# Patient Record
Sex: Female | Born: 1965 | Race: White | Hispanic: No | Smoking: Current every day smoker
Health system: Southern US, Community
[De-identification: ages and names within clinical notes are randomized; demographics above are authoritative.]

## PROBLEM LIST (undated history)

## (undated) DIAGNOSIS — D241 Benign neoplasm of right breast: Secondary | ICD-10-CM

## (undated) DIAGNOSIS — F17200 Nicotine dependence, unspecified, uncomplicated: Secondary | ICD-10-CM

## (undated) HISTORY — PX: TUBAL LIGATION: SHX77

## (undated) HISTORY — PX: BREAST EXCISIONAL BIOPSY: SUR124

## (undated) HISTORY — PX: APPENDECTOMY: SHX54

---

## 2000-06-27 ENCOUNTER — Other Ambulatory Visit: Admission: RE | Admit: 2000-06-27 | Discharge: 2000-06-27 | Payer: Self-pay | Admitting: *Deleted

## 2002-05-28 ENCOUNTER — Other Ambulatory Visit: Admission: RE | Admit: 2002-05-28 | Discharge: 2002-05-28 | Payer: Self-pay | Admitting: *Deleted

## 2010-06-12 ENCOUNTER — Encounter (HOSPITAL_COMMUNITY): Payer: Self-pay | Admitting: Obstetrics and Gynecology

## 2011-01-12 ENCOUNTER — Other Ambulatory Visit: Payer: Self-pay | Admitting: Obstetrics and Gynecology

## 2011-01-12 DIAGNOSIS — N644 Mastodynia: Secondary | ICD-10-CM

## 2011-01-12 DIAGNOSIS — N63 Unspecified lump in unspecified breast: Secondary | ICD-10-CM

## 2011-01-21 ENCOUNTER — Ambulatory Visit
Admission: RE | Admit: 2011-01-21 | Discharge: 2011-01-21 | Disposition: A | Discharge status: Home / Self Care | Payer: 59 | Source: Ambulatory Visit | Attending: Obstetrics and Gynecology | Admitting: Obstetrics and Gynecology

## 2011-01-21 DIAGNOSIS — N63 Unspecified lump in unspecified breast: Secondary | ICD-10-CM

## 2011-01-21 DIAGNOSIS — N644 Mastodynia: Secondary | ICD-10-CM

## 2011-09-20 ENCOUNTER — Telehealth: Payer: Self-pay | Admitting: Obstetrics and Gynecology

## 2011-09-21 NOTE — Telephone Encounter (Signed)
Left msg on pt's voice mail to call back rgd rx refill request. bt cma

## 2011-09-23 NOTE — Telephone Encounter (Signed)
Spoke with pt on 09/22/11 rgd rx refill request from [facility] . Pt stated that she is request rx. Told pt would consult with EP and get approved and once approved fax it back to the pharmacy . Per ep approved sertraline hcl 25 mg sig 1 po qd with 5 refills.bt cma

## 2012-02-02 ENCOUNTER — Ambulatory Visit: Payer: Self-pay | Admitting: Obstetrics and Gynecology

## 2012-02-20 ENCOUNTER — Other Ambulatory Visit: Payer: Self-pay | Admitting: Obstetrics and Gynecology

## 2012-02-23 ENCOUNTER — Ambulatory Visit (INDEPENDENT_AMBULATORY_CARE_PROVIDER_SITE_OTHER): Payer: BC Managed Care – PPO | Admitting: Obstetrics and Gynecology

## 2012-02-23 ENCOUNTER — Encounter: Payer: Self-pay | Admitting: Obstetrics and Gynecology

## 2012-02-23 VITALS — BP 110/72 | Wt 146.6 lb

## 2012-02-23 DIAGNOSIS — N6459 Other signs and symptoms in breast: Secondary | ICD-10-CM

## 2012-02-23 DIAGNOSIS — N6452 Nipple discharge: Secondary | ICD-10-CM

## 2012-02-23 NOTE — Progress Notes (Signed)
Subjective:    Hannah Glover is a 46 y.o. female, G2P2002, who presents for liquid leaking from right nipple for about 2 weeks . Last mammo 01/21/2011 wnl   The following portions of the patient's history were reviewed and updated as appropriate: allergies, current medications, past family history.  Review of Systems Pertinent items are noted in HPI.  Objective:    BP 110/72  Wt 146 lb 9.6 oz (66.497 kg)  LMP 02/17/2012    Weight:  Wt Readings from Last 1 Encounters:  02/23/12 146 lb 9.6 oz (66.497 kg)          BMI: There is no height on file to calculate BMI.  General Appearance: Alert, appropriate appearance for age. No acute distress Breast: no masses, + clear discharge at 2 o"clock of right breast. No leaking left breast   Assessment:    Right breast discharge    Plan:    Diagnostic Mammogram Prolactin and TSH Breast Pap Follow-up 1 month AEX and review results  Silverio Lay MD

## 2012-02-24 LAB — THYROID PANEL WITH TSH
Free Thyroxine Index: 2.7 (ref 1.0–3.9)
T3 Uptake: 26.4 % (ref 22.5–37.0)
T4, Total: 10.2 ug/dL (ref 5.0–12.5)
TSH: 0.944 u[IU]/mL (ref 0.350–4.500)

## 2012-02-24 LAB — PROLACTIN: Prolactin: 11.4 ng/mL

## 2012-02-28 ENCOUNTER — Telehealth: Payer: Self-pay | Admitting: Obstetrics and Gynecology

## 2012-02-28 NOTE — Telephone Encounter (Signed)
Spoke with pt requesting breast discharge test results. Informed pt results aren't back yet and we will inform her of results after the provider signs off on it. Pt agrees and voices understanding.

## 2012-02-29 ENCOUNTER — Telehealth: Payer: Self-pay | Admitting: Obstetrics and Gynecology

## 2012-02-29 NOTE — Telephone Encounter (Signed)
sr to review results

## 2012-02-29 NOTE — Telephone Encounter (Signed)
Pt notified that labs are in but not reviewed and breast pap results are not in yet.  Will call as soon as resulted and reviewed.  Pt agreeable.  ld

## 2012-03-06 ENCOUNTER — Telehealth: Payer: Self-pay | Admitting: Obstetrics and Gynecology

## 2012-03-06 ENCOUNTER — Other Ambulatory Visit: Payer: Self-pay | Admitting: Obstetrics and Gynecology

## 2012-03-06 DIAGNOSIS — N6452 Nipple discharge: Secondary | ICD-10-CM

## 2012-03-06 NOTE — Telephone Encounter (Signed)
TC to pt at  11:30. Lab results reviewed.  [facility] order to schedule diagnostic mammogram. Consult with LD who states unilateral diagnostic mammo to be ordered.  Pt to call to schedule appt.

## 2012-03-09 ENCOUNTER — Ambulatory Visit
Admission: RE | Admit: 2012-03-09 | Discharge: 2012-03-09 | Disposition: A | Discharge status: Home / Self Care | Payer: BC Managed Care – PPO | Source: Ambulatory Visit | Attending: Obstetrics and Gynecology | Admitting: Obstetrics and Gynecology

## 2012-03-09 ENCOUNTER — Other Ambulatory Visit: Payer: Self-pay | Admitting: Obstetrics and Gynecology

## 2012-03-09 DIAGNOSIS — N6452 Nipple discharge: Secondary | ICD-10-CM

## 2012-03-12 NOTE — Progress Notes (Signed)
Quick Note:  The recent mammogram is incomplete / abnormal suggesting a close follow-up / additional images. When is the next appointment to follow-up on this mammogram?  Please document in chart. ______ 

## 2012-03-13 ENCOUNTER — Telehealth: Payer: Self-pay

## 2012-03-13 NOTE — Telephone Encounter (Signed)
ductogram scheduled for Fri 03-16-12.  ld

## 2012-03-13 NOTE — Telephone Encounter (Signed)
Message copied by Larwance Rote on Tue Mar 13, 2012 12:06 PM ------      Message from: Silverio Lay      Created: Mon Mar 12, 2012  1:49 PM       The recent mammogram is incomplete / abnormal suggesting a close follow-up / additional images.      When is the next appointment to follow-up on this mammogram?       Please document in chart.

## 2012-03-13 NOTE — Telephone Encounter (Signed)
Message copied by Larwance Rote on Tue Mar 13, 2012 12:34 PM ------      Message from: Silverio Lay      Created: Mon Mar 12, 2012  1:49 PM       The recent mammogram is incomplete / abnormal suggesting a close follow-up / additional images.      When is the next appointment to follow-up on this mammogram?       Please document in chart.

## 2012-03-13 NOTE — Telephone Encounter (Signed)
Na  ld

## 2012-03-16 ENCOUNTER — Ambulatory Visit
Admission: RE | Admit: 2012-03-16 | Discharge: 2012-03-16 | Disposition: A | Discharge status: Home / Self Care | Payer: BC Managed Care – PPO | Source: Ambulatory Visit | Attending: Obstetrics and Gynecology | Admitting: Obstetrics and Gynecology

## 2012-03-16 DIAGNOSIS — N6452 Nipple discharge: Secondary | ICD-10-CM

## 2012-03-20 ENCOUNTER — Encounter: Payer: Self-pay | Admitting: Obstetrics and Gynecology

## 2012-03-20 ENCOUNTER — Telehealth: Payer: Self-pay

## 2012-03-20 NOTE — Telephone Encounter (Signed)
Pt calling about ductogram results.  States she has an appt with Dr Carolynne Edouard on 03-29-12 to discuss.  Pt is very worried and wants to know what she can expect.  Will send to SR for instruction.  ld

## 2012-03-23 ENCOUNTER — Ambulatory Visit (INDEPENDENT_AMBULATORY_CARE_PROVIDER_SITE_OTHER): Payer: BC Managed Care – PPO | Admitting: Surgery

## 2012-03-23 NOTE — Telephone Encounter (Signed)
Dr Carolynne Edouard will probably recommend a biopsy

## 2012-03-26 ENCOUNTER — Encounter: Payer: Self-pay | Admitting: Obstetrics and Gynecology

## 2012-03-27 ENCOUNTER — Telehealth: Payer: Self-pay

## 2012-03-28 ENCOUNTER — Telehealth: Payer: Self-pay | Admitting: Obstetrics and Gynecology

## 2012-03-28 NOTE — Telephone Encounter (Signed)
Returned pt's call. States is seeing Dr Carolynne Edouard 03/29/12. Questioning who will F/U any surgery she has done.  Informed will need to F/U with surgeon unless has other concerns and then can see Dr SR. Pt verbalizes comprehension.

## 2012-03-28 NOTE — Telephone Encounter (Signed)
Returned pt's call. LM to return call.   

## 2012-03-29 ENCOUNTER — Encounter (INDEPENDENT_AMBULATORY_CARE_PROVIDER_SITE_OTHER): Payer: Self-pay | Admitting: General Surgery

## 2012-03-29 ENCOUNTER — Ambulatory Visit (INDEPENDENT_AMBULATORY_CARE_PROVIDER_SITE_OTHER): Payer: BC Managed Care – PPO | Admitting: General Surgery

## 2012-03-29 VITALS — BP 112/68 | HR 76 | Temp 97.8°F | Resp 16 | Ht 67.0 in | Wt 152.2 lb

## 2012-03-29 DIAGNOSIS — D249 Benign neoplasm of unspecified breast: Secondary | ICD-10-CM | POA: Insufficient documentation

## 2012-03-29 NOTE — Progress Notes (Signed)
Subjective:     Patient ID: Hannah Glover, female   DOB: 05/05/1966, 46 y.o.   MRN: [id number]  HPI We are asked to see the patient in consultation by Dr. Juel Burrow to evaluate her for a right breast intraductal papilloma. The patient is a 46 her white female who is been experiencing spontaneous nipple discharge from the right breast for the last 6 months or so. She states that this occurs on a daily basis. The fluid is mostly been clear yellow fluid but occasionally she'll see some blood mixed in. She denies any breast pain. She does have a family history of breast cancer in her mother diagnosed at the age of 46.  Review of Systems  Constitutional: Negative.   HENT: Negative.   Eyes: Negative.   Respiratory: Negative.   Cardiovascular: Negative.   Gastrointestinal: Negative.   Genitourinary: Negative.   Musculoskeletal: Negative.   Skin: Negative.   Neurological: Negative.   Hematological: Negative.   Psychiatric/Behavioral: Negative.        Objective:   Physical Exam  Constitutional: She is oriented to person, place, and time. She appears well-developed and well-nourished.  HENT:  Head: Normocephalic and atraumatic.  Eyes: Conjunctivae normal and EOM are normal. Pupils are equal, round, and reactive to light.  Neck: Normal range of motion. Neck supple.  Cardiovascular: Normal rate, regular rhythm and normal heart sounds.   Pulmonary/Chest: Effort normal and breath sounds normal.       There is no palpable mass in either breast. There is no palpable axillary or supraclavicular cervical lymphadenopathy. There is discharge from the upper inner quadrant of the right nipple with palpation. The discharge is clear yellow and not bloody.  Abdominal: Soft. Bowel sounds are normal.  Musculoskeletal: Normal range of motion.  Neurological: She is alert and oriented to person, place, and time.  Skin: Skin is warm and dry.  Psychiatric: She has a normal mood and affect. Her behavior is normal.         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 Because this lesion is considered a high risk lesion I think it would be reasonable to remove it especially given the nature of the discharge. I've discussed with her in detail the risks and benefits of the operation to remove this area of the subareolar duct system and she understands and wishes to proceed.    Plan:     Plan for right breast lumpectomy of the subareolar ductal system

## 2012-03-29 NOTE — Telephone Encounter (Signed)
NS spoke with pt

## 2012-03-29 NOTE — Patient Instructions (Signed)
Plan for right breast lumpectomy 

## 2012-05-03 DIAGNOSIS — D249 Benign neoplasm of unspecified breast: Secondary | ICD-10-CM

## 2012-05-03 HISTORY — PX: BREAST SURGERY: SHX581

## 2012-05-08 ENCOUNTER — Encounter (INDEPENDENT_AMBULATORY_CARE_PROVIDER_SITE_OTHER): Payer: Self-pay | Admitting: General Surgery

## 2012-05-09 ENCOUNTER — Telehealth (INDEPENDENT_AMBULATORY_CARE_PROVIDER_SITE_OTHER): Payer: Self-pay | Admitting: General Surgery

## 2012-05-09 NOTE — Telephone Encounter (Signed)
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

## 2012-05-09 NOTE — Telephone Encounter (Signed)
Message copied by Littie Deeds on Wed May 09, 2012  5:04 PM ------      Message from: Littie Deeds      Created: Tue May 08, 2012  4:59 PM       Request Pathology 12/18 if not received yet.

## 2012-05-11 ENCOUNTER — Encounter (INDEPENDENT_AMBULATORY_CARE_PROVIDER_SITE_OTHER): Payer: Self-pay | Admitting: General Surgery

## 2012-05-18 ENCOUNTER — Encounter (INDEPENDENT_AMBULATORY_CARE_PROVIDER_SITE_OTHER): Payer: Self-pay

## 2012-05-18 ENCOUNTER — Telehealth (INDEPENDENT_AMBULATORY_CARE_PROVIDER_SITE_OTHER): Payer: Self-pay | Admitting: General Surgery

## 2012-05-18 NOTE — Telephone Encounter (Signed)
Patient called to get a copy of path report. Copy sent from pathology. Patient aware she will get a copy at her follow up appt with Dr Carolynne Edouard on Tuesday.

## 2012-05-22 ENCOUNTER — Ambulatory Visit (INDEPENDENT_AMBULATORY_CARE_PROVIDER_SITE_OTHER): Payer: BC Managed Care – PPO | Admitting: General Surgery

## 2012-05-22 ENCOUNTER — Encounter (INDEPENDENT_AMBULATORY_CARE_PROVIDER_SITE_OTHER): Payer: Self-pay | Admitting: General Surgery

## 2012-05-22 VITALS — BP 114/70 | HR 72 | Temp 97.3°F | Resp 16 | Ht 67.0 in | Wt 155.4 lb

## 2012-05-22 DIAGNOSIS — D249 Benign neoplasm of unspecified breast: Secondary | ICD-10-CM

## 2012-05-22 NOTE — Progress Notes (Signed)
Subjective:     Patient ID: Hannah Glover, female   DOB: August 06, 1965, 46 y.o.   MRN: [id number]  HPI The patient is a 74 her white female who is about 3 weeks status post right breast lumpectomy for an intraductal papilloma. She continues to have some intermittent sharp shooting pains in the right breast. She denies any fevers or chills.  Review of Systems     Objective:   Physical Exam On exam her right breast incision is healing nicely with no sign of infection or significant seroma.    Assessment:     3 weeks status post right lumpectomy for a papilloma    Plan:     At this point I think she is doing well. We will plan to see her back in one month to check her progress and she is still having some pain.

## 2012-06-21 ENCOUNTER — Encounter (INDEPENDENT_AMBULATORY_CARE_PROVIDER_SITE_OTHER): Payer: BC Managed Care – PPO | Admitting: General Surgery

## 2012-06-25 ENCOUNTER — Encounter (INDEPENDENT_AMBULATORY_CARE_PROVIDER_SITE_OTHER): Payer: BC Managed Care – PPO | Admitting: General Surgery

## 2012-07-07 ENCOUNTER — Other Ambulatory Visit: Payer: Self-pay

## 2012-07-11 ENCOUNTER — Ambulatory Visit (INDEPENDENT_AMBULATORY_CARE_PROVIDER_SITE_OTHER): Payer: BC Managed Care – PPO | Admitting: General Surgery

## 2012-07-11 ENCOUNTER — Encounter (INDEPENDENT_AMBULATORY_CARE_PROVIDER_SITE_OTHER): Payer: Self-pay | Admitting: General Surgery

## 2012-07-11 VITALS — BP 112/82 | HR 94 | Temp 98.5°F | Resp 18 | Ht 67.0 in | Wt 154.0 lb

## 2012-07-11 DIAGNOSIS — D249 Benign neoplasm of unspecified breast: Secondary | ICD-10-CM

## 2012-07-11 NOTE — Progress Notes (Signed)
Subjective:     Patient ID: Hannah Glover, female   DOB: 12-09-1965, 47 y.o.   MRN: [id number]  HPI The patient is a 47 year old white female who is about 2 months status post right breast lumpectomy for a papilloma. She has done well and has no complaints today. The soreness of the breast has resolved.  Review of Systems     Objective:   Physical Exam On exam her right breast incision is healing nicely with no sign of infection or significant seroma.    Assessment:     The patient is 2 months status post lumpectomy for benign disease     Plan:     At this point I would encourage her to continue to do regular self exams. She will need followup annually with her regular medical doctor. We will plan to see her back on a when necessary basis

## 2012-07-11 NOTE — Patient Instructions (Signed)
Continue regular self exams  

## 2012-08-01 ENCOUNTER — Encounter (INDEPENDENT_AMBULATORY_CARE_PROVIDER_SITE_OTHER): Payer: Self-pay | Admitting: General Surgery

## 2012-08-17 ENCOUNTER — Other Ambulatory Visit: Payer: Self-pay | Admitting: Obstetrics and Gynecology

## 2012-08-20 LAB — PAP IG W/ RFLX HPV ASCU

## 2013-01-15 ENCOUNTER — Other Ambulatory Visit: Payer: Self-pay | Admitting: Obstetrics and Gynecology

## 2013-01-15 DIAGNOSIS — N63 Unspecified lump in unspecified breast: Secondary | ICD-10-CM

## 2013-01-15 DIAGNOSIS — N644 Mastodynia: Secondary | ICD-10-CM

## 2013-03-28 ENCOUNTER — Other Ambulatory Visit: Payer: Self-pay

## 2013-04-11 IMAGING — MG MM DUCTOGRAM UNILATERAL *R*
4 series · 4 of 4 positions shown · non-contrast
Comparison: none

CLINICAL DATA: Single duct right nipple discharge

[R CC (1 of 2)]
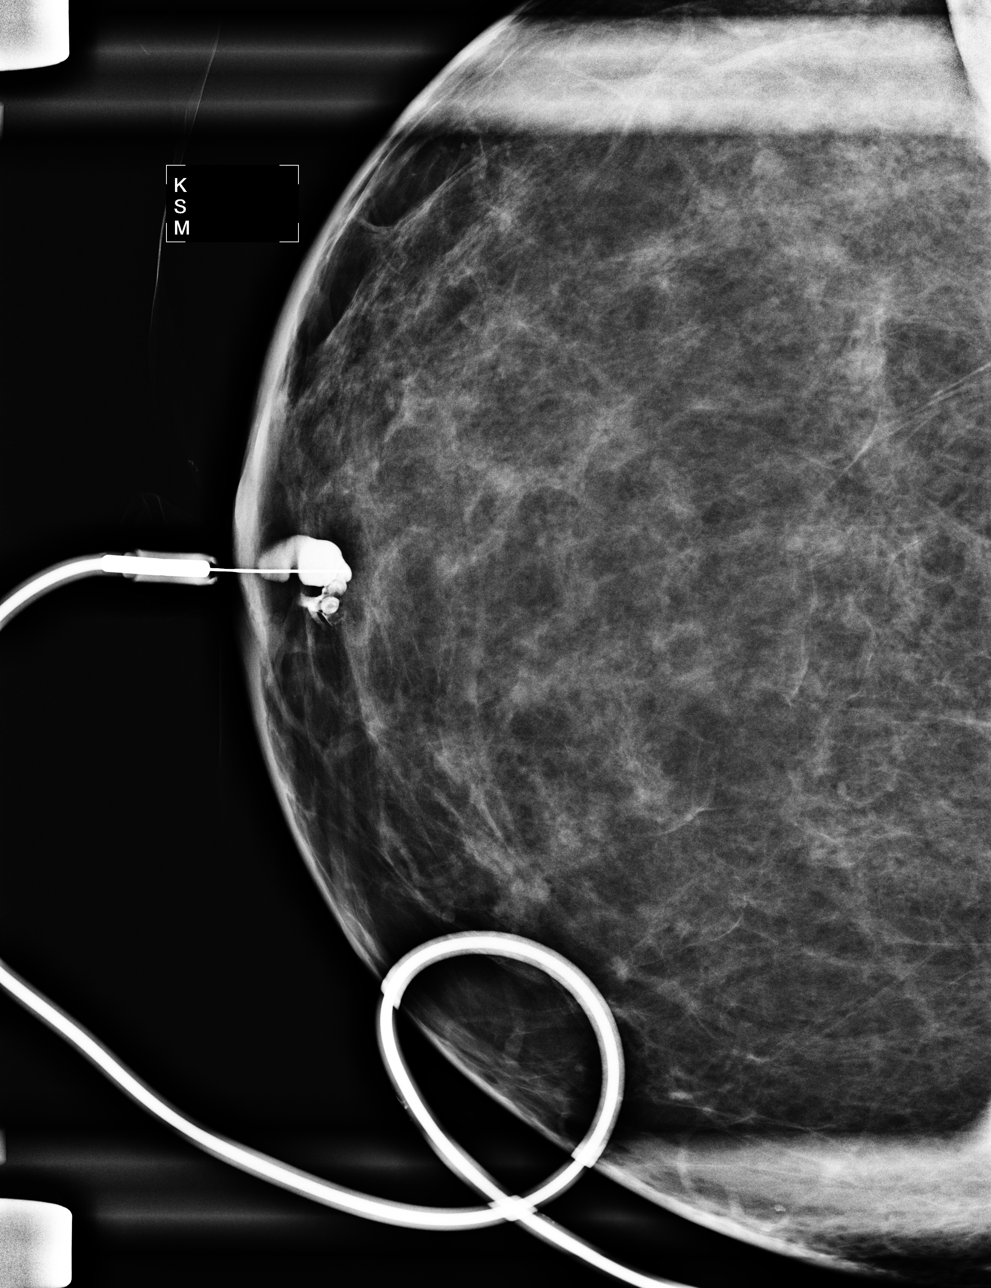

[R ML (1 of 2)]
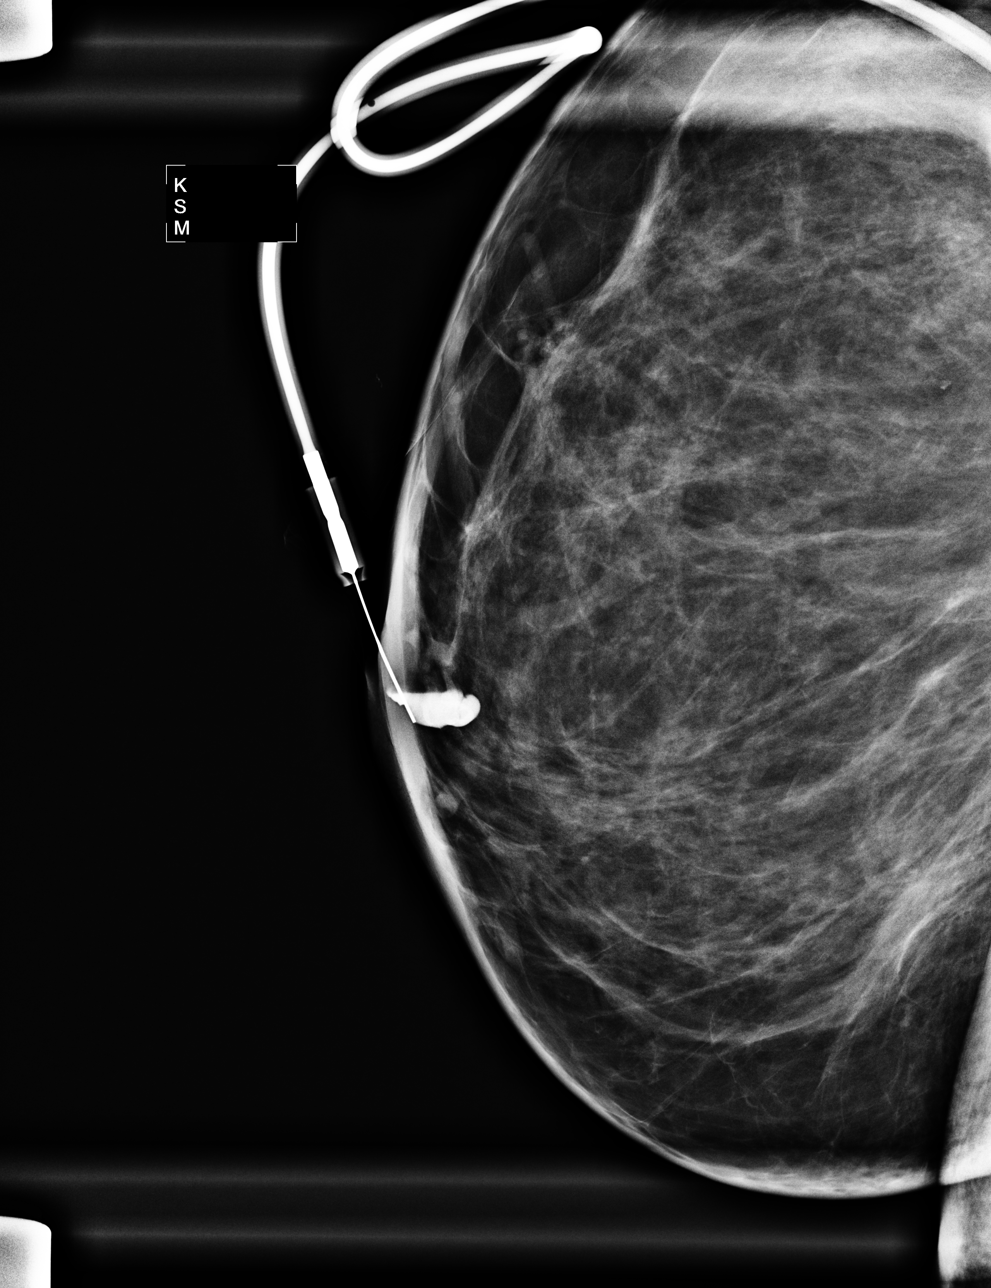

[R CC (2 of 2)]
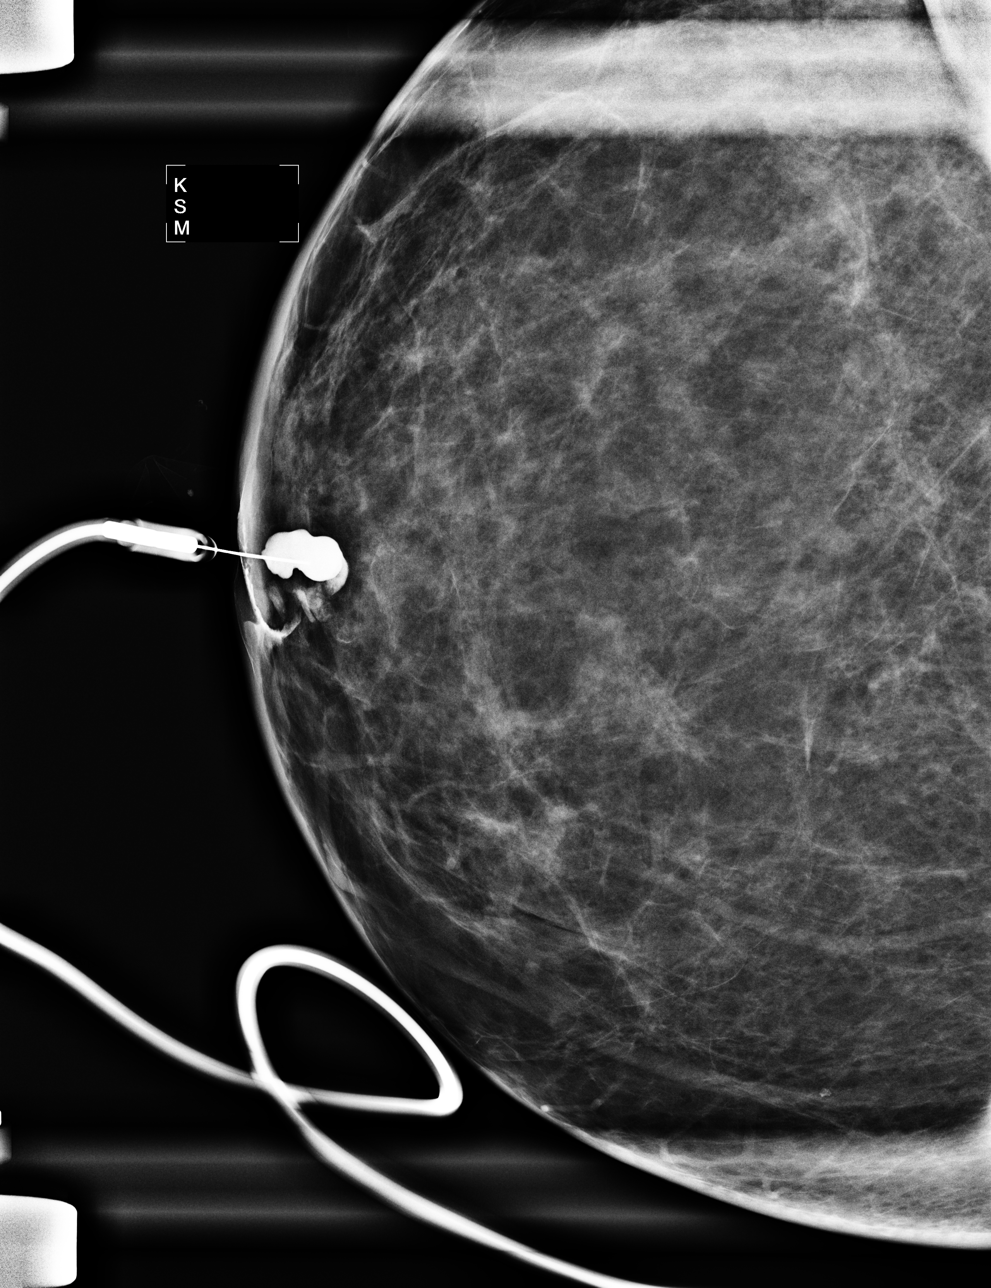

[R ML (2 of 2)]
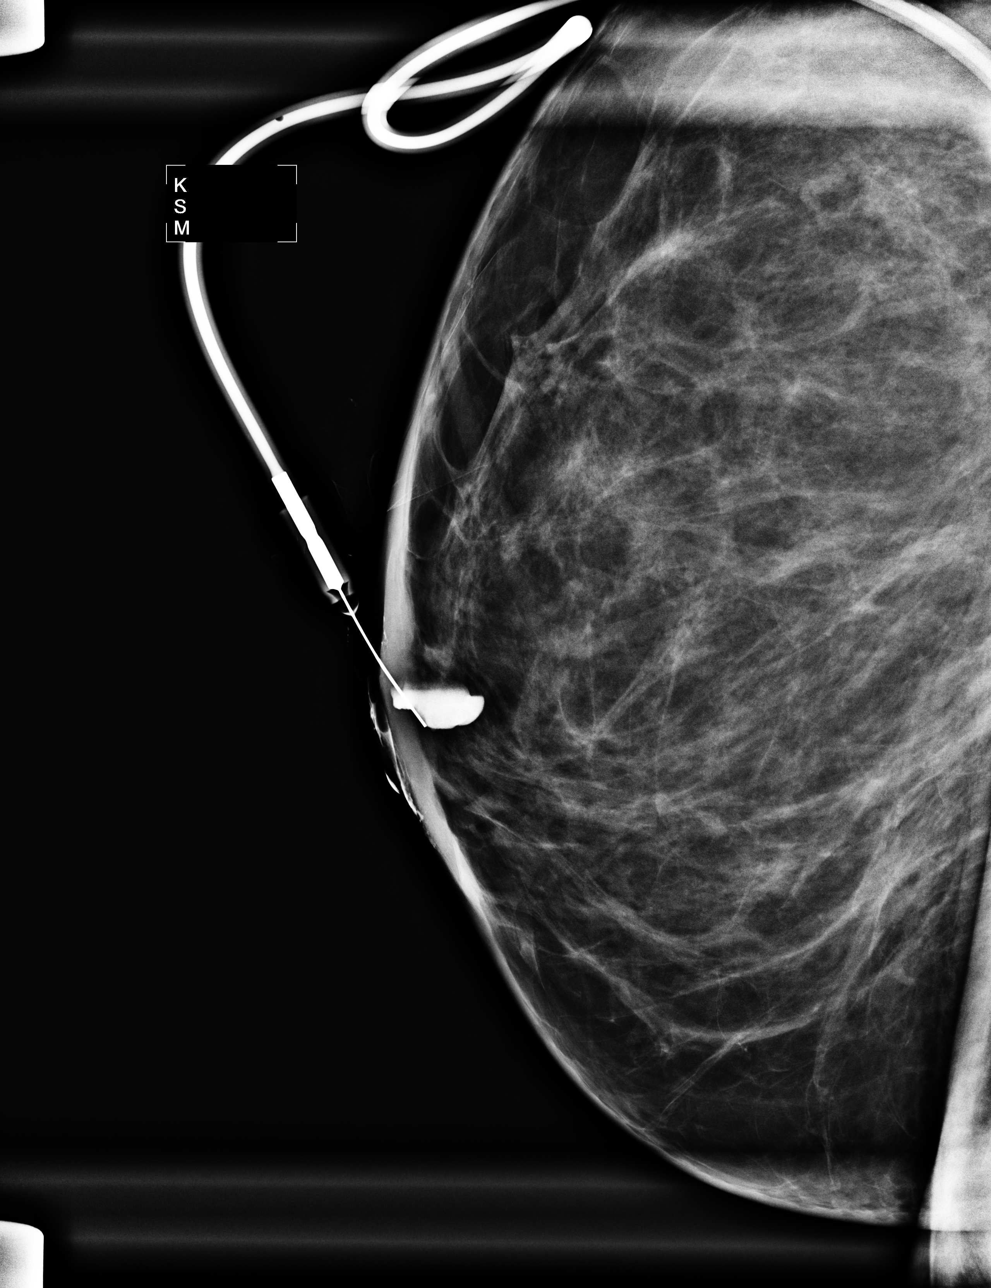

[4 of 4 positions shown; findings below may reference images not displayed]

Right BREAST DUCTOGRAM

On physical exam, I was able to expressed single duct clear nipple
discharge.  After explaining the risks and benefits of the
procedure to patient, informed consent was obtained.  The duct of
concern in the right breast was cannulated.  A ductogram was
performed.  Spot magnification CC and lateral view demonstrates a 3
mm filling defects a central retroareolar duct.
IMPRESSION: Right intraductal mass.

RECOMMENDATION:
Surgical excision.  The patient has appointment with Dr. Ciudad on
March 29, 2012

BI-RADS CATEGORY 4:  Suspicious abnormality - biopsy should be
considered.

## 2013-10-06 ENCOUNTER — Encounter (HOSPITAL_COMMUNITY): Payer: Self-pay | Admitting: Emergency Medicine

## 2013-10-06 ENCOUNTER — Emergency Department (HOSPITAL_COMMUNITY): Payer: No Typology Code available for payment source

## 2013-10-06 ENCOUNTER — Emergency Department (HOSPITAL_COMMUNITY)
Admission: EM | Admit: 2013-10-06 | Discharge: 2013-10-07 | Disposition: A | Discharge status: Home / Self Care | Payer: No Typology Code available for payment source | Attending: Emergency Medicine | Admitting: Emergency Medicine

## 2013-10-06 DIAGNOSIS — W2203XA Walked into furniture, initial encounter: Secondary | ICD-10-CM | POA: Insufficient documentation

## 2013-10-06 DIAGNOSIS — Y929 Unspecified place or not applicable: Secondary | ICD-10-CM | POA: Insufficient documentation

## 2013-10-06 DIAGNOSIS — F17200 Nicotine dependence, unspecified, uncomplicated: Secondary | ICD-10-CM | POA: Insufficient documentation

## 2013-10-06 DIAGNOSIS — Y9389 Activity, other specified: Secondary | ICD-10-CM | POA: Insufficient documentation

## 2013-10-06 DIAGNOSIS — S91109A Unspecified open wound of unspecified toe(s) without damage to nail, initial encounter: Secondary | ICD-10-CM | POA: Insufficient documentation

## 2013-10-06 DIAGNOSIS — S91209A Unspecified open wound of unspecified toe(s) with damage to nail, initial encounter: Secondary | ICD-10-CM

## 2013-10-06 NOTE — ED Notes (Signed)
Pt. reports left big toe pain accidentally hit against mattress this evening , bleeding controlled.

## 2013-10-06 NOTE — ED Notes (Signed)
Patient transported to X-ray 

## 2013-10-07 NOTE — ED Notes (Signed)
PA at bedside.

## 2013-10-07 NOTE — ED Provider Notes (Signed)
CSN: [id number]     Arrival date & time 10/06/13  2319 History   First MD Initiated Contact with Patient 10/06/13 2351     Chief Complaint  Patient presents with  . Toe Injury     (Consider location/radiation/quality/duration/timing/severity/associated sxs/prior Treatment) HPI Comments: Patient is 48 year old female who was moving a mattress into her bedroom when she hit the side of this with her left great toe and partially pulled off her great toenail - reports no bleeding currently, pain with ambulation.  Patient is a 48 y.o. female presenting with toe pain. The history is provided by the patient. No language interpreter was used.  Toe Pain This is a new problem. The current episode started today. The problem occurs constantly. The problem has been unchanged. Associated symptoms include arthralgias. Pertinent negatives include no abdominal pain, chest pain, congestion, coughing, fever, headaches, joint swelling, nausea, neck pain, rash, swollen glands or vomiting. Nothing aggravates the symptoms. She has tried nothing for the symptoms. The treatment provided no relief.    History reviewed. No pertinent past medical history. Past Surgical History  Procedure Laterality Date  . Tubal ligation    . Appendectomy    . Breast surgery  05/03/12    Lumpectomy   Family History  Problem Relation Age of Onset  . Stroke Maternal Grandmother   . Liver disease Father   . Breast cancer Mother 14  . Cancer Mother     breast  . Cancer Paternal Uncle     colon   History  Substance Use Topics  . Smoking status: Current Every Day Smoker -- 0.25 packs/day    Types: Cigarettes  . Smokeless tobacco: Never Used  . Alcohol Use: Yes   OB History   Grav Para Term Preterm Abortions TAB SAB Ect Mult Living   2 2 2       2      Review of Systems  Constitutional: Negative for fever.  HENT: Negative for congestion.   Respiratory: Negative for cough.   Cardiovascular: Negative for chest pain.   Gastrointestinal: Negative for nausea, vomiting and abdominal pain.  Musculoskeletal: Positive for arthralgias. Negative for joint swelling and neck pain.  Skin: Negative for rash.  Neurological: Negative for headaches.  All other systems reviewed and are negative.     Allergies  Review of patient's allergies indicates no known allergies.  Home Medications   Prior to Admission medications   Medication Sig Start Date End Date Taking? Authorizing Provider  HYDROcodone-acetaminophen (NORCO/VICODIN) 5-325 MG per tablet as needed. 05/03/12   Historical Provider, MD   BP 107/71  Pulse 94  Temp(Src) 98.7 F (37.1 C) (Oral)  Resp 14  Ht 5\' 7"  (1.702 m)  Wt 143 lb (64.864 kg)  BMI 22.39 kg/m2  SpO2 97%  LMP 09/29/2013 Physical Exam  Nursing note and vitals reviewed. Constitutional: She is oriented to person, place, and time. She appears well-developed and well-nourished. No distress.  HENT:  Head: Normocephalic and atraumatic.  Eyes: Conjunctivae are normal. No scleral icterus.  Neck: Normal range of motion.  Pulmonary/Chest: Effort normal.  Musculoskeletal: Normal range of motion. She exhibits tenderness. She exhibits no edema.  Tenderness to palpation of left great toe, sensation intact, cap refill < 3 seconds.  Neurological: She is alert and oriented to person, place, and time. She exhibits normal muscle tone. Coordination normal.  Skin: Skin is warm and dry. No rash noted. No erythema. No pallor.  Psychiatric: She has a normal mood and affect.  Her behavior is normal. Judgment and thought content normal.    ED Course  NAIL REMOVAL Date/Time: 10/07/2013 12:46 AM Performed by: Joelyn Oms, Emilija Bohman C. Authorized by: Archie Balboa Consent: Verbal consent obtained. written consent not obtained. Risks and benefits: risks, benefits and alternatives were discussed Consent given by: patient Patient understanding: patient states understanding of the procedure being  performed Patient consent: the patient's understanding of the procedure matches consent given Procedure consent: procedure consent matches procedure scheduled Relevant documents: relevant documents not present or verified Test results: test results not available Site marked: the operative site was not marked Imaging studies: imaging studies not available Patient identity confirmed: verbally with patient and arm band Time out: Immediately prior to procedure a "time out" was called to verify the correct patient, procedure, equipment, support staff and site/side marked as required. Location: left foot Location details: left big toe Anesthesia: digital block Local anesthetic: lidocaine 1% without epinephrine Anesthetic total: 4 ml Patient sedated: no Preparation: skin prepped with Betadine Amount removed: complete Wedge excision of skin of nail fold: no Nail bed sutured: no Nail matrix removed: partial Removed nail replaced and anchored: no Dressing: petrolatum-impregnated gauze Patient tolerance: Patient tolerated the procedure well with no immediate complications.   (including critical care time) Labs Review Labs Reviewed - No data to display  Imaging Review No results found.   EKG Interpretation None      MDM   Nail removal  Patient here after striking her left great toe on a mattress and partially avulsing her left great toe nail,  Remainder of the nail removed, dressed with petroleum gauze and patient placed in post op shoe.     Idalia Needle Joelyn Oms, [place] 10/07/13 [phone]

## 2013-10-07 NOTE — Discharge Instructions (Signed)
Fingernail or Toenail Loss All or part of your fingernail or toenail has been lost. This may or may not grow back as a normal nail. A special non-stick bandage has been put on your finger or toe tightly to prevent bleeding. HOME CARE INSTRUCTIONS  The tips of fingers and toes are full of nerves and injuries are often very painful. The following will help you decrease the pain and obtain the best outcome.  Keep your hand or foot elevated above your heart to relieve pain and swelling. This will require lying in bed or on a couch with the hand or leg on pillows or sitting in a recliner with the leg up. Letting your hand or leg dangle may increase swelling, slow healing and cause throbbing pain.  Keep your dressing dry and clean.  Change your bandage in 24 hours after going home.  After your bandage is changed, soak your hand or foot in warm soapy water for 10 to 20 minutes. Do this 3 times per day. This helps reduce pain and swelling. After soaking, apply a clean, dry bandage. Change your bandage if it is wet or dirty.  Only take over-the-counter or prescription medicines for pain, discomfort, or fever as directed by your caregiver.  See your caregiver as needed for problems. SEEK IMMEDIATE MEDICAL CARE IF:   You have increased pain, swelling, drainage, or bleeding.  You have a fever. MAKE SURE YOU:   Understand these instructions.  Will watch your condition.  Will get help right away if you are not doing well or get worse. Document Released: 03/31/2006 Document Revised: 08/01/2011 Document Reviewed: 06/20/2006 [facility] Patient Information 2014 [place], [place].  Nail Avulsion Injury Nail avulsion means that you have lost the whole, or part of a nail. The nail will usually grow back in 2 to 6 months. If your injury damaged the growth center of the nail, the nail may be deformed, split, or not stuck to the nail bed. Sometimes the avulsed nail is stitched back in place. This provides  temporary protection to the nail bed until the new nail grows in.  HOME CARE INSTRUCTIONS   Raise (elevate) your injury as much as possible.  Protect the injury and cover it with bandages (dressings) or splints as instructed.  Change dressings as instructed. SEEK MEDICAL CARE IF:   There is increasing pain, redness, or swelling.  You cannot move your fingers or toes. Document Released: 06/16/2004 Document Revised: 08/01/2011 Document Reviewed: 04/10/2009 [facility] Patient Information 2014 [place], [place].  Toenail Removal Toenails may need to be removed because of injury, infections, or to correct abnormal growth. A special non-stick bandage will likely be put tightly on your toe to prevent bleeding. Often times a new nail will grow back. Sometimes the new nail may be deformed. Most of the time when a nail is lost, it will gradually heal, but may be sensitive for a long time. HOME CARE INSTRUCTIONS   Keep your foot elevated to relieve pain and swelling. This will require lying in bed or on a couch with the leg on pillows or sitting in a recliner with the leg up. Walking or letting your leg dangle may increase swelling, slow healing, and cause throbbing pain.  Keep your bandage dry and clean.  Change your bandage in 24 hours.  After your bandage is changed, soak your foot in warm, soapy water for 10 to 20 minutes. Do this 3 times per day. This helps reduce pain and swelling. After soaking your foot apply  a clean, dry bandage. Change your bandage if it is wet or dirty.  Only take over-the-counter or prescription medicines for pain, discomfort, or fever as directed by your caregiver.  See your caregiver as needed for problems. You might need a tetanus shot now if:  You have no idea when you had the last one.  You have never had a tetanus shot before.  The injured area had dirt in it. If you need a tetanus shot, and you decide not to get one, there is a rare chance of getting  tetanus. Sickness from tetanus can be serious. If you did get a tetanus shot, your arm may swell, get red and warm to the touch at the shot site. This is common and not a problem. SEEK IMMEDIATE MEDICAL CARE IF:   You have increased pain, swelling, redness, warmth, drainage, or bleeding.  You have a fever.  You have swelling that spreads from your toe into your foot. Document Released: 02/05/2003 Document Revised: 08/01/2011 Document Reviewed: 05/19/2008 [facility] Patient Information 2014 [place], [place].

## 2013-10-07 NOTE — ED Provider Notes (Signed)
Medical screening examination/treatment/procedure(s) were performed by non-physician practitioner and as supervising physician I was immediately available for consultation/collaboration.   EKG Interpretation None       Teressa Lower, MD 10/07/13 [phone]

## 2014-03-24 ENCOUNTER — Encounter (HOSPITAL_COMMUNITY): Payer: Self-pay | Admitting: Emergency Medicine

## 2015-06-03 ENCOUNTER — Emergency Department (HOSPITAL_COMMUNITY)
Admission: EM | Admit: 2015-06-03 | Discharge: 2015-06-03 | Disposition: A | Discharge status: Home / Self Care | Payer: No Typology Code available for payment source | Attending: Emergency Medicine | Admitting: Emergency Medicine

## 2015-06-03 ENCOUNTER — Encounter (HOSPITAL_COMMUNITY): Payer: Self-pay

## 2015-06-03 DIAGNOSIS — R1013 Epigastric pain: Secondary | ICD-10-CM | POA: Diagnosis not present

## 2015-06-03 DIAGNOSIS — R109 Unspecified abdominal pain: Secondary | ICD-10-CM | POA: Diagnosis present

## 2015-06-03 DIAGNOSIS — F17210 Nicotine dependence, cigarettes, uncomplicated: Secondary | ICD-10-CM | POA: Insufficient documentation

## 2015-06-03 DIAGNOSIS — Z79899 Other long term (current) drug therapy: Secondary | ICD-10-CM | POA: Insufficient documentation

## 2015-06-03 DIAGNOSIS — R1010 Upper abdominal pain, unspecified: Secondary | ICD-10-CM

## 2015-06-03 LAB — COMPREHENSIVE METABOLIC PANEL
ALT: 23 U/L (ref 14–54)
AST: 21 U/L (ref 15–41)
Albumin: 4.1 g/dL (ref 3.5–5.0)
Alkaline Phosphatase: 54 U/L (ref 38–126)
Anion gap: 8 (ref 5–15)
BUN: 8 mg/dL (ref 6–20)
CO2: 27 mmol/L (ref 22–32)
Calcium: 9.1 mg/dL (ref 8.9–10.3)
Chloride: 105 mmol/L (ref 101–111)
Creatinine, Ser: 0.75 mg/dL (ref 0.44–1.00)
GFR calc Af Amer: >60 mL/min (ref >60)
GFR calc non Af Amer: >60 mL/min (ref >60)
Glucose, Bld: 105 mg/dL — ABNORMAL HIGH (ref 65–99)
Potassium: 4.3 mmol/L (ref 3.5–5.1)
Sodium: 140 mmol/L (ref 135–145)
Total Bilirubin: 0.7 mg/dL (ref 0.3–1.2)
Total Protein: 7.4 g/dL (ref 6.5–8.1)

## 2015-06-03 LAB — LIPASE, BLOOD: Lipase: 105 U/L — ABNORMAL HIGH (ref 11–51)

## 2015-06-03 LAB — CBC WITH DIFFERENTIAL/PLATELET
Basophils Absolute: 0 10*3/uL (ref 0.0–0.1)
Basophils Relative: 0 %
Eosinophils Absolute: 0.1 10*3/uL (ref 0.0–0.7)
Eosinophils Relative: 1 %
HCT: 41.1 % (ref 36.0–46.0)
Hemoglobin: 13.3 g/dL (ref 12.0–15.0)
Lymphocytes Relative: 18 %
Lymphs Abs: 1.4 10*3/uL (ref 0.7–4.0)
MCH: 33.8 pg (ref 26.0–34.0)
MCHC: 32.4 g/dL (ref 30.0–36.0)
MCV: 104.6 fL — ABNORMAL HIGH (ref 78.0–100.0)
Monocytes Absolute: 0.4 10*3/uL (ref 0.1–1.0)
Monocytes Relative: 6 %
Neutro Abs: 5.5 10*3/uL (ref 1.7–7.7)
Neutrophils Relative %: 75 %
Platelets: 154 10*3/uL (ref 150–400)
RBC: 3.93 MIL/uL (ref 3.87–5.11)
RDW: 12.8 % (ref 11.5–15.5)
WBC: 7.4 10*3/uL (ref 4.0–10.5)

## 2015-06-03 LAB — URINALYSIS, ROUTINE W REFLEX MICROSCOPIC
Bilirubin Urine: NEGATIVE
Glucose, UA: NEGATIVE mg/dL
Hgb urine dipstick: NEGATIVE
Ketones, ur: NEGATIVE mg/dL
Leukocytes, UA: NEGATIVE
Nitrite: NEGATIVE
Protein, ur: NEGATIVE mg/dL
Specific Gravity, Urine: 1.004 — ABNORMAL LOW (ref 1.005–1.030)
pH: 7 (ref 5.0–8.0)

## 2015-06-03 MED ORDER — GI COCKTAIL ~~LOC~~
30.0000 mL | Freq: Once | ORAL | Status: AC
  Administered 2015-06-03: 30 mL via ORAL
  Filled 2015-06-03: qty 30

## 2015-06-03 MED ORDER — FAMOTIDINE 20 MG PO TABS: 20.0000 mg | ORAL_TABLET | Freq: Two times a day (BID) | ORAL | Status: DC

## 2015-06-03 MED ORDER — FAMOTIDINE 20 MG PO TABS
20.0000 mg | ORAL_TABLET | Freq: Once | ORAL | Status: AC
  Administered 2015-06-03: 20 mg via ORAL
  Filled 2015-06-03: qty 1

## 2015-06-03 MED ORDER — PANTOPRAZOLE SODIUM 40 MG PO TBEC
40.0000 mg | DELAYED_RELEASE_TABLET | Freq: Every day | ORAL | Status: DC
  Administered 2015-06-03: 40 mg via ORAL
  Filled 2015-06-03: qty 1

## 2015-06-03 NOTE — ED Notes (Signed)
Per pt, pain in left flank since October.  Pt did have some vomiting last night.  Denies fever or urinary symptoms.  Pt does not have primary MD.  Here today b/c she thinks she should find out what is going on.

## 2015-06-17 NOTE — ED Provider Notes (Signed)
CSN: [id number]     Arrival date & time 06/03/15  0911 History   First MD Initiated Contact with Patient 06/03/15 1002     Chief Complaint  Patient presents with  . Flank Pain     (Consider location/radiation/quality/duration/timing/severity/associated sxs/prior Treatment) HPI   50 year old female with abdominal pain. Epigastric. Patient reports intermittent left flank pain since October. Current pain feels different than that though. The pain in epigastrium began about 3 days ago. Initially was a mild ache but has since become more intense. Nondistended vomit once last night. Anorexia. No fevers or chills. Denies any past history of reflux or ulcers. Discussed evidence of diffuse. She is a smoker. Reports she drinks occasionally. Abdominal/pelvic surgical history significant for appendectomy and tubal ligation.  History reviewed. No pertinent past medical history. Past Surgical History  Procedure Laterality Date  . Tubal ligation    . Appendectomy    . Breast surgery  05/03/12    Lumpectomy   Family History  Problem Relation Age of Onset  . Stroke Maternal Grandmother   . Liver disease Father   . Breast cancer Mother 52  . Cancer Mother     breast  . Cancer Paternal Uncle     colon   Social History  Substance Use Topics  . Smoking status: Current Every Day Smoker -- 0.25 packs/day    Types: Cigarettes  . Smokeless tobacco: Never Used  . Alcohol Use: Yes   OB History    Gravida Para Term Preterm AB TAB SAB Ectopic Multiple Living   2 2 2       2      Review of Systems  All systems reviewed and negative, other than as noted in HPI.    Allergies  Review of patient's allergies indicates no known allergies.  Home Medications   Prior to Admission medications   Medication Sig Start Date End Date Taking? Authorizing Provider  Multiple Vitamins-Minerals (MULTIVITAMIN WITH MINERALS) tablet Take 1 tablet by mouth daily.   Yes Historical Provider, MD  famotidine (PEPCID)  20 MG tablet Take 1 tablet (20 mg total) by mouth 2 (two) times daily. 06/03/15   Virgel Manifold, MD   BP 135/71 mmHg  Pulse 77  Temp(Src) 98.3 F (36.8 C) (Oral)  Resp 14  SpO2 99%  LMP 05/08/2015 Physical Exam  Constitutional: She appears well-developed and well-nourished. No distress.  HENT:  Head: Normocephalic and atraumatic.  Eyes: Conjunctivae are normal. Right eye exhibits no discharge. Left eye exhibits no discharge.  Neck: Neck supple.  Cardiovascular: Normal rate, regular rhythm and normal heart sounds.  Exam reveals no gallop and no friction rub.   No murmur heard. Pulmonary/Chest: Effort normal and breath sounds normal. No respiratory distress.  Abdominal: Soft. She exhibits no distension. There is tenderness. There is no rebound and no guarding.  Mild epigastric tenderness without rebound or guarding. No distention.  Musculoskeletal: She exhibits no edema or tenderness.  Neurological: She is alert.  Skin: Skin is warm and dry.  Psychiatric: She has a normal mood and affect. Her behavior is normal. Thought content normal.  Nursing note and vitals reviewed.   ED Course  Procedures (including critical care time) Labs Review Labs Reviewed  CBC WITH DIFFERENTIAL/PLATELET - Abnormal; Notable for the following:    MCV 104.6 (*)    All other components within normal limits  COMPREHENSIVE METABOLIC PANEL - Abnormal; Notable for the following:    Glucose, Bld 105 (*)    All other components within normal  limits  URINALYSIS, ROUTINE W REFLEX MICROSCOPIC (NOT AT [facility]) - Abnormal; Notable for the following:    Specific Gravity, Urine 1.004 (*)    All other components within normal limits  LIPASE, BLOOD - Abnormal; Notable for the following:    Lipase 105 (*)    All other components within normal limits    Imaging Review No results found. I have personally reviewed and evaluated these images and lab results as part of my medical decision-making.   EKG  Interpretation None      MDM   Final diagnoses:  Upper abdominal pain    50 year old female with upper abdominal pain. She has mild tenderness in her epigastrium. Initial vitals with tachycardia. This has since resolved. She does have mild elevation in her lipase. Denies significant alcohol use. LFTs are normal. I'm not overly impressed with her abdominal exam at this point and imaging was deferred. She reports improvement of her symptoms. She is tolerating by mouth. We'll give a trial of an H2 blocker. Return precautions were discussed.  Virgel Manifold, MD 06/17/15 [phone]

## 2017-02-24 ENCOUNTER — Other Ambulatory Visit: Payer: Self-pay | Admitting: Obstetrics and Gynecology

## 2017-02-24 DIAGNOSIS — N644 Mastodynia: Secondary | ICD-10-CM

## 2017-02-28 ENCOUNTER — Ambulatory Visit
Admission: RE | Admit: 2017-02-28 | Discharge: 2017-02-28 | Disposition: A | Discharge status: Home / Self Care | Payer: 59 | Source: Ambulatory Visit | Attending: Obstetrics and Gynecology | Admitting: Obstetrics and Gynecology

## 2017-02-28 ENCOUNTER — Other Ambulatory Visit: Payer: Self-pay | Admitting: Obstetrics and Gynecology

## 2017-02-28 DIAGNOSIS — N644 Mastodynia: Secondary | ICD-10-CM

## 2017-02-28 DIAGNOSIS — R921 Mammographic calcification found on diagnostic imaging of breast: Secondary | ICD-10-CM

## 2017-02-28 DIAGNOSIS — N6310 Unspecified lump in the right breast, unspecified quadrant: Secondary | ICD-10-CM

## 2017-03-06 ENCOUNTER — Other Ambulatory Visit: Payer: Self-pay | Admitting: Obstetrics and Gynecology

## 2017-03-06 DIAGNOSIS — N6310 Unspecified lump in the right breast, unspecified quadrant: Secondary | ICD-10-CM

## 2017-03-07 ENCOUNTER — Ambulatory Visit
Admission: RE | Admit: 2017-03-07 | Discharge: 2017-03-07 | Disposition: A | Discharge status: Home / Self Care | Payer: 59 | Source: Ambulatory Visit | Attending: Obstetrics and Gynecology | Admitting: Obstetrics and Gynecology

## 2017-03-07 ENCOUNTER — Other Ambulatory Visit: Payer: Self-pay | Admitting: Obstetrics and Gynecology

## 2017-03-07 DIAGNOSIS — R921 Mammographic calcification found on diagnostic imaging of breast: Secondary | ICD-10-CM

## 2017-03-07 DIAGNOSIS — N6310 Unspecified lump in the right breast, unspecified quadrant: Secondary | ICD-10-CM

## 2017-03-17 ENCOUNTER — Other Ambulatory Visit: Payer: Self-pay | Admitting: General Surgery

## 2017-03-17 ENCOUNTER — Ambulatory Visit: Payer: Self-pay | Admitting: General Surgery

## 2017-03-17 DIAGNOSIS — D241 Benign neoplasm of right breast: Secondary | ICD-10-CM

## 2017-04-03 ENCOUNTER — Other Ambulatory Visit: Payer: Self-pay

## 2017-04-03 ENCOUNTER — Encounter (HOSPITAL_BASED_OUTPATIENT_CLINIC_OR_DEPARTMENT_OTHER): Payer: Self-pay | Admitting: *Deleted

## 2017-04-05 ENCOUNTER — Ambulatory Visit
Admission: RE | Admit: 2017-04-05 | Discharge: 2017-04-05 | Disposition: A | Discharge status: Home / Self Care | Payer: 59 | Source: Ambulatory Visit | Attending: General Surgery | Admitting: General Surgery

## 2017-04-05 DIAGNOSIS — D241 Benign neoplasm of right breast: Secondary | ICD-10-CM

## 2017-04-05 NOTE — Progress Notes (Signed)
Ensure pre surgery drink given with instructions to complete by 0730 dos, pt verbalized understanding. 

## 2017-04-07 ENCOUNTER — Ambulatory Visit
Admission: RE | Admit: 2017-04-07 | Discharge: 2017-04-07 | Disposition: A | Discharge status: Home / Self Care | Payer: 59 | Source: Ambulatory Visit | Attending: General Surgery | Admitting: General Surgery

## 2017-04-07 ENCOUNTER — Encounter (HOSPITAL_BASED_OUTPATIENT_CLINIC_OR_DEPARTMENT_OTHER)
Admission: RE | Disposition: A | Discharge status: Home / Self Care | Payer: Self-pay | Source: Ambulatory Visit | Attending: General Surgery

## 2017-04-07 ENCOUNTER — Ambulatory Visit (HOSPITAL_BASED_OUTPATIENT_CLINIC_OR_DEPARTMENT_OTHER): Payer: 59 | Admitting: Certified Registered"

## 2017-04-07 ENCOUNTER — Ambulatory Visit (HOSPITAL_BASED_OUTPATIENT_CLINIC_OR_DEPARTMENT_OTHER)
Admission: RE | Admit: 2017-04-07 | Discharge: 2017-04-07 | Disposition: A | Discharge status: Home / Self Care | Payer: 59 | Source: Ambulatory Visit | Attending: General Surgery | Admitting: General Surgery

## 2017-04-07 ENCOUNTER — Other Ambulatory Visit: Payer: Self-pay

## 2017-04-07 ENCOUNTER — Encounter (HOSPITAL_BASED_OUTPATIENT_CLINIC_OR_DEPARTMENT_OTHER): Payer: Self-pay | Admitting: Certified Registered"

## 2017-04-07 DIAGNOSIS — F17200 Nicotine dependence, unspecified, uncomplicated: Secondary | ICD-10-CM | POA: Diagnosis not present

## 2017-04-07 DIAGNOSIS — D241 Benign neoplasm of right breast: Secondary | ICD-10-CM | POA: Diagnosis present

## 2017-04-07 DIAGNOSIS — Z803 Family history of malignant neoplasm of breast: Secondary | ICD-10-CM | POA: Diagnosis not present

## 2017-04-07 HISTORY — PX: BREAST LUMPECTOMY WITH RADIOACTIVE SEED LOCALIZATION: SHX6424

## 2017-04-07 HISTORY — DX: Nicotine dependence, unspecified, uncomplicated: F17.200

## 2017-04-07 HISTORY — DX: Benign neoplasm of right breast: D24.1

## 2017-04-07 SURGERY — BREAST LUMPECTOMY WITH RADIOACTIVE SEED LOCALIZATION
Anesthesia: General | Site: Breast | Laterality: Right

## 2017-04-07 MED ORDER — GABAPENTIN 300 MG PO CAPS
300.0000 mg | ORAL_CAPSULE | ORAL | Status: AC
  Administered 2017-04-07: 300 mg via ORAL

## 2017-04-07 MED ORDER — ONDANSETRON HCL 4 MG/2ML IJ SOLN
INTRAMUSCULAR | Status: DC | PRN
  Administered 2017-04-07: 4 mg via INTRAVENOUS

## 2017-04-07 MED ORDER — MIDAZOLAM HCL 2 MG/2ML IJ SOLN
INTRAMUSCULAR | Status: AC
  Filled 2017-04-07: qty 2

## 2017-04-07 MED ORDER — FENTANYL CITRATE (PF) 100 MCG/2ML IJ SOLN
INTRAMUSCULAR | Status: AC
  Filled 2017-04-07: qty 2

## 2017-04-07 MED ORDER — CELECOXIB 200 MG PO CAPS
ORAL_CAPSULE | ORAL | Status: AC
  Filled 2017-04-07: qty 1

## 2017-04-07 MED ORDER — BUPIVACAINE-EPINEPHRINE (PF) 0.25% -1:200000 IJ SOLN
INTRAMUSCULAR | Status: DC | PRN
  Administered 2017-04-07: 18 mL

## 2017-04-07 MED ORDER — PROMETHAZINE HCL 25 MG/ML IJ SOLN: 6.2500 mg | INTRAMUSCULAR | Status: DC | PRN

## 2017-04-07 MED ORDER — CHLORHEXIDINE GLUCONATE CLOTH 2 % EX PADS: 6.0000 | MEDICATED_PAD | Freq: Once | CUTANEOUS | Status: DC

## 2017-04-07 MED ORDER — LIDOCAINE 2% (20 MG/ML) 5 ML SYRINGE
INTRAMUSCULAR | Status: DC | PRN
  Administered 2017-04-07: 100 mg via INTRAVENOUS

## 2017-04-07 MED ORDER — GABAPENTIN 300 MG PO CAPS
ORAL_CAPSULE | ORAL | Status: AC
  Filled 2017-04-07: qty 1

## 2017-04-07 MED ORDER — MIDAZOLAM HCL 2 MG/2ML IJ SOLN
1.0000 mg | INTRAMUSCULAR | Status: DC | PRN
  Administered 2017-04-07: 2 mg via INTRAVENOUS

## 2017-04-07 MED ORDER — ACETAMINOPHEN 500 MG PO TABS
1000.0000 mg | ORAL_TABLET | ORAL | Status: AC
  Administered 2017-04-07: 1000 mg via ORAL

## 2017-04-07 MED ORDER — CEFAZOLIN SODIUM-DEXTROSE 2-4 GM/100ML-% IV SOLN
2.0000 g | INTRAVENOUS | Status: AC
  Administered 2017-04-07: 2 g via INTRAVENOUS

## 2017-04-07 MED ORDER — ACETAMINOPHEN 500 MG PO TABS
ORAL_TABLET | ORAL | Status: AC
  Filled 2017-04-07: qty 2

## 2017-04-07 MED ORDER — SCOPOLAMINE 1 MG/3DAYS TD PT72: 1.0000 | MEDICATED_PATCH | Freq: Once | TRANSDERMAL | Status: DC | PRN

## 2017-04-07 MED ORDER — CELECOXIB 200 MG PO CAPS
200.0000 mg | ORAL_CAPSULE | ORAL | Status: AC
  Administered 2017-04-07: 200 mg via ORAL

## 2017-04-07 MED ORDER — EPHEDRINE SULFATE 50 MG/ML IJ SOLN
INTRAMUSCULAR | Status: DC | PRN
  Administered 2017-04-07 (×3): 10 mg via INTRAVENOUS

## 2017-04-07 MED ORDER — PROPOFOL 10 MG/ML IV BOLUS
INTRAVENOUS | Status: DC | PRN
  Administered 2017-04-07: 150 mg via INTRAVENOUS

## 2017-04-07 MED ORDER — DEXAMETHASONE SODIUM PHOSPHATE 4 MG/ML IJ SOLN
INTRAMUSCULAR | Status: DC | PRN
  Administered 2017-04-07: 10 mg via INTRAVENOUS

## 2017-04-07 MED ORDER — LACTATED RINGERS IV SOLN
INTRAVENOUS | Status: DC
  Administered 2017-04-07 (×2): via INTRAVENOUS

## 2017-04-07 MED ORDER — FENTANYL CITRATE (PF) 100 MCG/2ML IJ SOLN
50.0000 ug | INTRAMUSCULAR | Status: DC | PRN
Start: 2017-04-07 — End: 2017-04-07
  Administered 2017-04-07: 100 ug via INTRAVENOUS

## 2017-04-07 MED ORDER — CEFAZOLIN SODIUM-DEXTROSE 2-4 GM/100ML-% IV SOLN
INTRAVENOUS | Status: AC
  Filled 2017-04-07: qty 100

## 2017-04-07 MED ORDER — FENTANYL CITRATE (PF) 100 MCG/2ML IJ SOLN
25.0000 ug | INTRAMUSCULAR | Status: DC | PRN
  Administered 2017-04-07: 50 ug via INTRAVENOUS
  Administered 2017-04-07 (×2): 25 ug via INTRAVENOUS

## 2017-04-07 MED ORDER — HYDROCODONE-ACETAMINOPHEN 5-325 MG PO TABS: 1.0000 | ORAL_TABLET | Freq: Four times a day (QID) | ORAL | 0 refills | Status: DC | PRN

## 2017-04-07 SURGICAL SUPPLY — 44 items
APPLIER CLIP 9.375 MED OPEN (MISCELLANEOUS) ×3
BLADE SURG 15 STRL LF DISP TIS (BLADE) ×2 IMPLANT
BLADE SURG 15 STRL SS (BLADE) ×4
CANISTER SUC SOCK COL 7IN (MISCELLANEOUS) IMPLANT
CANISTER SUCT 1200ML W/VALVE (MISCELLANEOUS) IMPLANT
CHLORAPREP W/TINT 26ML (MISCELLANEOUS) ×3 IMPLANT
CLIP APPLIE 9.375 MED OPEN (MISCELLANEOUS) ×1 IMPLANT
COVER BACK TABLE 60X90IN (DRAPES) ×3 IMPLANT
COVER MAYO STAND STRL (DRAPES) ×3 IMPLANT
COVER PROBE W GEL 5X96 (DRAPES) ×3 IMPLANT
DECANTER SPIKE VIAL GLASS SM (MISCELLANEOUS) IMPLANT
DERMABOND ADVANCED (GAUZE/BANDAGES/DRESSINGS) ×2
DERMABOND ADVANCED .7 DNX12 (GAUZE/BANDAGES/DRESSINGS) ×1 IMPLANT
DEVICE DUBIN W/COMP PLATE 8390 (MISCELLANEOUS) ×3 IMPLANT
DRAPE LAPAROSCOPIC ABDOMINAL (DRAPES) ×3 IMPLANT
DRAPE UTILITY XL STRL (DRAPES) ×3 IMPLANT
ELECT COATED BLADE 2.86 ST (ELECTRODE) ×3 IMPLANT
ELECT REM PT RETURN 9FT ADLT (ELECTROSURGICAL) ×3
ELECTRODE REM PT RTRN 9FT ADLT (ELECTROSURGICAL) ×1 IMPLANT
GLOVE BIO SURGEON STRL SZ 6.5 (GLOVE) ×4 IMPLANT
GLOVE BIO SURGEON STRL SZ7.5 (GLOVE) ×3 IMPLANT
GLOVE BIO SURGEONS STRL SZ 6.5 (GLOVE) ×2
GLOVE BIOGEL PI IND STRL 6.5 (GLOVE) ×2 IMPLANT
GLOVE BIOGEL PI INDICATOR 6.5 (GLOVE) ×4
GOWN STRL REUS W/ TWL LRG LVL3 (GOWN DISPOSABLE) ×2 IMPLANT
GOWN STRL REUS W/TWL LRG LVL3 (GOWN DISPOSABLE) ×4
ILLUMINATOR WAVEGUIDE N/F (MISCELLANEOUS) IMPLANT
KIT MARKER MARGIN INK (KITS) ×3 IMPLANT
LIGHT WAVEGUIDE WIDE FLAT (MISCELLANEOUS) IMPLANT
NEEDLE HYPO 25X1 1.5 SAFETY (NEEDLE) ×3 IMPLANT
NS IRRIG 1000ML POUR BTL (IV SOLUTION) ×3 IMPLANT
PACK BASIN DAY SURGERY FS (CUSTOM PROCEDURE TRAY) ×3 IMPLANT
PENCIL BUTTON HOLSTER BLD 10FT (ELECTRODE) ×3 IMPLANT
SLEEVE SCD COMPRESS KNEE MED (MISCELLANEOUS) ×3 IMPLANT
SPONGE LAP 18X18 X RAY DECT (DISPOSABLE) ×3 IMPLANT
SUT MON AB 4-0 PC3 18 (SUTURE) ×3 IMPLANT
SUT SILK 2 0 SH (SUTURE) IMPLANT
SUT VICRYL 3-0 CR8 SH (SUTURE) ×3 IMPLANT
SYR CONTROL 10ML LL (SYRINGE) ×3 IMPLANT
TOWEL OR 17X24 6PK STRL BLUE (TOWEL DISPOSABLE) ×3 IMPLANT
TOWEL OR NON WOVEN STRL DISP B (DISPOSABLE) ×3 IMPLANT
TUBE CONNECTING 20'X1/4 (TUBING)
TUBE CONNECTING 20X1/4 (TUBING) IMPLANT
YANKAUER SUCT BULB TIP NO VENT (SUCTIONS) IMPLANT

## 2017-04-07 NOTE — Anesthesia Preprocedure Evaluation (Addendum)
Anesthesia Evaluation  Patient identified by MRN, date of birth, ID band Patient awake    Reviewed: Allergy & Precautions, NPO status , Patient's Chart, lab work & pertinent test results  History of Anesthesia Complications Negative for: history of anesthetic complications  Airway Mallampati: II  TM Distance: >3 FB Neck ROM: Full    Dental no notable dental hx. (+) Dental Advisory Given   Pulmonary neg pulmonary ROS, Current Smoker,  Was told not to smoke, but did smoke dos   Pulmonary exam normal        Cardiovascular negative cardio ROS Normal cardiovascular exam     Neuro/Psych negative neurological ROS  negative psych ROS   GI/Hepatic negative GI ROS, Neg liver ROS,   Endo/Other  negative endocrine ROS  Renal/GU negative Renal ROS  negative genitourinary   Musculoskeletal negative musculoskeletal ROS (+)   Abdominal   Peds negative pediatric ROS (+)  Hematology negative hematology ROS (+)   Anesthesia Other Findings   Reproductive/Obstetrics negative OB ROS                            Anesthesia Physical Anesthesia Plan  ASA: II  Anesthesia Plan: General   Post-op Pain Management:    Induction: Intravenous  PONV Risk Score and Plan: 4 or greater and Ondansetron, Dexamethasone, Scopolamine patch - Pre-op and Diphenhydramine  Airway Management Planned: LMA  Additional Equipment:   Intra-op Plan:   Post-operative Plan: Extubation in OR  Informed Consent: I have reviewed the patients History and Physical, chart, labs and discussed the procedure including the risks, benefits and alternatives for the proposed anesthesia with the patient or authorized representative who has indicated his/her understanding and acceptance.   Dental advisory given  Plan Discussed with: CRNA and Anesthesiologist  Anesthesia Plan Comments:         Anesthesia Quick Evaluation

## 2017-04-07 NOTE — Anesthesia Postprocedure Evaluation (Signed)
Anesthesia Post Note  Patient: [name]  Procedure(s) Performed: RIGHT BREAST LUMPECTOMY WITH RADIOACTIVE SEED LOCALIZATION ERAS PATHWAY (Right Breast)     Patient location during evaluation: PACU Anesthesia Type: General Level of consciousness: sedated Pain management: pain level controlled Vital Signs Assessment: post-procedure vital signs reviewed and stable Respiratory status: spontaneous breathing and respiratory function stable Cardiovascular status: stable Postop Assessment: no apparent nausea or vomiting Anesthetic complications: no    Last Vitals:  Vitals:   04/07/17 1245 04/07/17 1300  BP: 125/81   Pulse: 74 74  Resp: 19 (!) 22  Temp:    SpO2: 100% 99%    Last Pain:  Vitals:   04/07/17 1300  TempSrc:   PainSc: 3                  Seeley Southgate DANIEL

## 2017-04-07 NOTE — Transfer of Care (Signed)
Immediate Anesthesia Transfer of Care Note  Patient: Hannah Glover  Procedure(s) Performed: RIGHT BREAST LUMPECTOMY WITH RADIOACTIVE SEED LOCALIZATION ERAS PATHWAY (Right Breast)  Patient Location: PACU  Anesthesia Type:General  Level of Consciousness: sedated  Airway & Oxygen Therapy: Patient Spontanous Breathing and Patient connected to face mask oxygen  Post-op Assessment: Report given to RN and Post -op Vital signs reviewed and stable  Post vital signs: Reviewed and stable  Last Vitals:  Vitals:   04/07/17 1207 04/07/17 1208  BP:    Pulse: 69 68  Resp: 10 (!) 9  Temp:    SpO2: 100% 100%    Last Pain:  Vitals:   04/07/17 0957  TempSrc:   PainSc: 2       Patients Stated Pain Goal: 2 (23/36/12 2449)  Complications: No apparent anesthesia complications

## 2017-04-07 NOTE — Interval H&P Note (Signed)
History and Physical Interval Note:  04/07/2017 11:00 AM  Hannah Glover  has presented today for surgery, with the diagnosis of right breast papilloma  The various methods of treatment have been discussed with the patient and family. After consideration of risks, benefits and other options for treatment, the patient has consented to  Procedure(s): RIGHT BREAST LUMPECTOMY WITH RADIOACTIVE SEED LOCALIZATION ERAS PATHWAY (Right) as a surgical intervention .  The patient's history has been reviewed, patient examined, no change in status, stable for surgery.  I have reviewed the patient's chart and labs.  Questions were answered to the patient's satisfaction.     TOTH III,PAUL S

## 2017-04-07 NOTE — Discharge Instructions (Signed)

## 2017-04-07 NOTE — H&P (Signed)
Hannah Glover  Location: [facility] Patient #: [id number] DOB: 1965-07-12 Married / Language: English / Race: White Female   History of Present Illness The patient is a 51 year old female who presents with a complaint of Breast pain. We are asked to see the patient in consultation by Dr. Dorise Bullion to evaluate her for a right breast papilloma. The patient is a 51 year old white female who has been experiencing pain in the right breast for the last few months. She localizes the pain behind the nipple. She says that the pain comes and goes but it is occurring every day and she describes the pain as severe. She denies any discharge from the nipple. She does have a history of a previous papilloma removed from the right breast. She also has a history of breast cancer in her mother.   Past Surgical History  Breast Biopsy  Bilateral.  Diagnostic Studies History  Colonoscopy  never Mammogram  within last year Pap Smear  1-5 years ago  Allergies  No Known Drug Allergies  Allergies Reconciled   Medication History  Medications Reconciled  Social History  Alcohol use  Moderate alcohol use. Caffeine use  Coffee. No drug use  Tobacco use  Current every day smoker.  Family History  Arthritis  Brother. Breast Cancer  Mother. Depression  Sister. Diabetes Mellitus  Father, Mother. Heart Disease  Father. Thyroid problems  Mother.  Pregnancy / Birth History Age at menarche  60 years. Age of menopause  87-55 Gravida  2 Irregular periods  Maternal age  51-20 Para  2  Other Problems Lump In Breast     Review of Systems  General Present- Weight Loss. Not Present- Appetite Loss, Chills, Fatigue, Fever, Night Sweats and Weight Gain. Skin Not Present- Change in Wart/Mole, Dryness, Hives, Jaundice, New Lesions, Non-Healing Wounds, Rash and Ulcer. HEENT Not Present- Earache, Hearing Loss, Hoarseness, Nose Bleed, Oral Ulcers, Ringing in the  Ears, Seasonal Allergies, Sinus Pain, Sore Throat, Visual Disturbances, Wears glasses/contact lenses and Yellow Eyes. Respiratory Not Present- Bloody sputum, Chronic Cough, Difficulty Breathing, Snoring and Wheezing. Cardiovascular Not Present- Chest Pain, Difficulty Breathing Lying Down, Leg Cramps, Palpitations, Rapid Heart Rate, Shortness of Breath and Swelling of Extremities. Gastrointestinal Not Present- Abdominal Pain, Bloating, Bloody Stool, Change in Bowel Habits, Chronic diarrhea, Constipation, Difficulty Swallowing, Excessive gas, Gets full quickly at meals, Hemorrhoids, Indigestion, Nausea, Rectal Pain and Vomiting. Female Genitourinary Not Present- Frequency, Nocturia, Painful Urination, Pelvic Pain and Urgency. Musculoskeletal Not Present- Back Pain, Joint Pain, Joint Stiffness, Muscle Pain, Muscle Weakness and Swelling of Extremities. Neurological Not Present- Decreased Memory, Fainting, Headaches, Numbness, Seizures, Tingling, Tremor, Trouble walking and Weakness. Psychiatric Not Present- Anxiety, Bipolar, Change in Sleep Pattern, Depression, Fearful and Frequent crying. Endocrine Not Present- Cold Intolerance, Excessive Hunger, Hair Changes, Heat Intolerance, Hot flashes and New Diabetes. Hematology Not Present- Blood Thinners, Easy Bruising, Excessive bleeding, Gland problems, HIV and Persistent Infections.  Vitals  Weight: 130.4 lb Height: 67in Body Surface Area: 1.69 m Body Mass Index: 20.42 kg/m  Temp.: 98.41F  Pulse: 74 (Regular)  BP: 120/70 (Sitting, Left Arm, Standard)       Physical Exam  General Mental Status-Alert. General Appearance-Consistent with stated age. Hydration-Well hydrated. Voice-Normal.  Head and Neck Head-normocephalic, atraumatic with no lesions or palpable masses. Trachea-midline. Thyroid Gland Characteristics - normal size and consistency.  Eye Eyeball - Bilateral-Extraocular movements  intact. Sclera/Conjunctiva - Bilateral-No scleral icterus.  Chest and Lung Exam Chest and lung exam reveals -quiet, even  and easy respiratory effort with no use of accessory muscles and on auscultation, normal breath sounds, no adventitious sounds and normal vocal resonance. Inspection Chest Wall - Normal. Back - normal.  Breast Note: The patient has symmetric dense fibrocystic tissue bilaterally. There is no discrete palpable mass in either breast. There is no palpable axillary, supraclavicular, or cervical lymphadenopathy.   Cardiovascular Cardiovascular examination reveals -normal heart sounds, regular rate and rhythm with no murmurs and normal pedal pulses bilaterally.  Abdomen Inspection Inspection of the abdomen reveals - No Hernias. Skin - Scar - no surgical scars. Palpation/Percussion Palpation and Percussion of the abdomen reveal - Soft, Non Tender, No Rebound tenderness, No Rigidity (guarding) and No hepatosplenomegaly. Auscultation Auscultation of the abdomen reveals - Bowel sounds normal.  Neurologic Neurologic evaluation reveals -alert and oriented x 3 with no impairment of recent or remote memory. Mental Status-Normal.  Musculoskeletal Normal Exam - Left-Upper Extremity Strength Normal and Lower Extremity Strength Normal. Normal Exam - Right-Upper Extremity Strength Normal and Lower Extremity Strength Normal.  Lymphatic Head & Neck  General Head & Neck Lymphatics: Bilateral - Description - Normal. Axillary  General Axillary Region: Bilateral - Description - Normal. Tenderness - Non Tender. Femoral & Inguinal  Generalized Femoral & Inguinal Lymphatics: Bilateral - Description - Normal. Tenderness - Non Tender.    Assessment & Plan INTRADUCTAL PAPILLOMA OF BREAST, RIGHT (D24.1) Impression: The patient appears to have a papilloma in the lower outer quadrant of the right breast. Because of her family history of breast cancer and the pain she is  having she would prefer that this area be removed. I think this is a reasonable thing to do. I have discussed with her in detail the risks and benefits of the operation as well as some of the technical aspects and she understands and wishes to proceed. I will plan for a right breast radioactive seed localized lumpectomy Current Plans Pt Education - Breast Diseases: discussed with patient and provided information.

## 2017-04-07 NOTE — Anesthesia Procedure Notes (Signed)
Procedure Name: LMA Insertion Date/Time: 04/07/2017 11:22 AM Performed by: Maryella Shivers, CRNA Pre-anesthesia Checklist: Patient identified, Emergency Drugs available, Suction available and Patient being monitored Patient Re-evaluated:Patient Re-evaluated prior to induction Oxygen Delivery Method: Circle system utilized Preoxygenation: Pre-oxygenation with 100% oxygen Induction Type: IV induction Ventilation: Mask ventilation without difficulty LMA: LMA inserted LMA Size: 4.0 Number of attempts: 1 Airway Equipment and Method: Bite block Placement Confirmation: positive ETCO2 Tube secured with: Tape Dental Injury: Teeth and Oropharynx as per pre-operative assessment

## 2017-04-07 NOTE — Op Note (Signed)
04/07/2017  11:59 AM  PATIENT:  Hannah Glover  51 y.o. female  PRE-OPERATIVE DIAGNOSIS:  right breast papilloma  POST-OPERATIVE DIAGNOSIS:  right breast papilloma  PROCEDURE:  Procedure(s): RIGHT BREAST LUMPECTOMY WITH RADIOACTIVE SEED LOCALIZATION ERAS PATHWAY (Right)  SURGEON:  Surgeon(s) and Role:    * Jovita Kussmaul, MD - Primary  PHYSICIAN ASSISTANT:   ASSISTANTS: none   ANESTHESIA:   local and general  EBL:  minimal   BLOOD ADMINISTERED:none  DRAINS: none   LOCAL MEDICATIONS USED:  MARCAINE     SPECIMEN:  Source of Specimen:  right breast tissue  DISPOSITION OF SPECIMEN:  PATHOLOGY  COUNTS:  YES  TOURNIQUET:  * No tourniquets in log *  DICTATION: .Dragon Dictation   After informed consent was obtained the patient was brought to the operating room and placed in the supine position on the operating table.  After adequate induction of general anesthesia the patient's right breast was prepped with ChloraPrep, allowed to dry, and draped in usual sterile manner.  An appropriate timeout was performed.  Previously an I-125 seed was placed in the lower outer right breast to mark an area of intraductal papilloma.  The neoprobe was sent to I-125 in the area of radioactivity was readily identified.  The area around this was infiltrated with quarter percent Marcaine.  A curvilinear incision was made along the inferior lateral edge of the areola with a 15 blade knife.  The incision was carried through the skin and subcutaneous tissue sharply with electrocautery.  Dissection was carried towards the radioactive seed under the direction of the neoprobe.  Once I approached the seed a circular portion of breast tissue was excised sharply around the radioactive seed will check in the area of radioactivity frequently with the neoprobe.  Once the specimen was removed it was oriented with the appropriate paint colors.  A specimen radiograph was obtained that showed the clip and seed to  be near the center of the specimen.  The specimen was then sent to pathology for further evaluation.  Hemostasis was achieved using the Bovie electrocautery.  The wound was irrigated with saline and infiltrated with more quarter percent Marcaine.  The deep layer of the wound was then closed with layers of interrupted 3-0 Vicryl stitches.  The skin was then closed with interrupted 4-0 Monocryl subcuticular stitches.  Dermabond dressings were applied.  The patient tolerated the procedure well.  At the end of the case all needle sponge and instrument counts were correct.  The patient was then awakened and taken to recovery in stable condition.  PLAN OF CARE: Discharge to home after PACU  PATIENT DISPOSITION:  PACU - hemodynamically stable.   Delay start of Pharmacological VTE agent (>24hrs) due to surgical blood loss or risk of bleeding: not applicable

## 2017-04-10 ENCOUNTER — Encounter (HOSPITAL_BASED_OUTPATIENT_CLINIC_OR_DEPARTMENT_OTHER): Payer: Self-pay | Admitting: General Surgery

## 2019-05-17 IMAGING — MG MM CLIP PLACEMENT
1 series · 1 of 1 positions shown · non-contrast
Comparison: Previous exam(s).

CLINICAL DATA: Evaluate clip placement on the right

EXAM:
DIAGNOSTIC RIGHT MAMMOGRAM POST ULTRASOUND BIOPSY

[R ML]
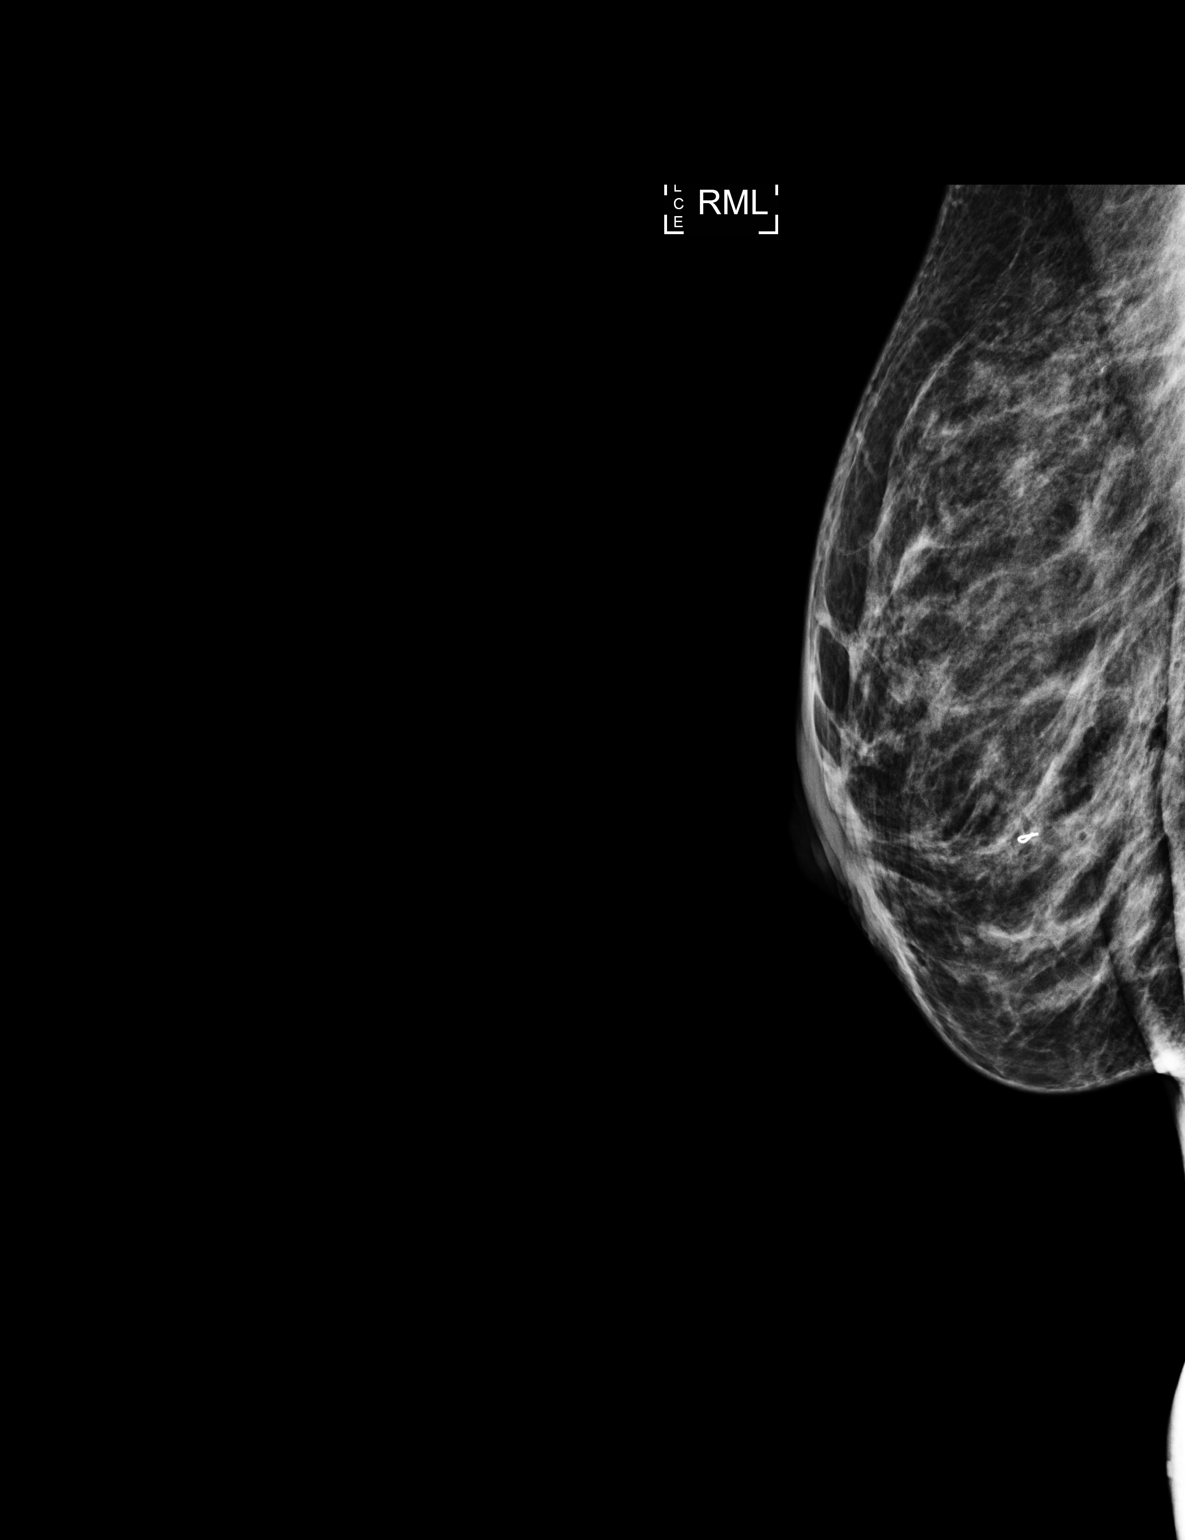

[1 of 1 positions shown; findings below may reference images not displayed]

FINDINGS: Mammographic images were obtained following ultrasound guided biopsy
of a right breast mass. The ribbon shaped clip is in expected
location.
IMPRESSION: Appropriate clip placement on the right.

Final Assessment: Post Procedure Mammograms for Marker Placement

## 2019-05-17 IMAGING — US US BREAST BX W LOC DEV 1ST LESION IMG BX SPEC US GUIDE*R*
1 series · 9 of 9 positions shown · non-contrast
Comparison: Previous exams

ADDENDUM:
Pathology revealed FIBROCYSTIC CHANGES WITH USUAL DUCTAL HYPERPLASIA
AND CALCIFICATIONS of the Left breast. This was found to be
concordant byDr. Jorgy Carrein. Pathology revealed DUCTAL
PAPILLOMA, FIBROCYSTIC CHANGES WITH CALCIFICATIONS of the Right
breast, 8:00 o'clock. This was found to be concordant by Dr. Godiinez
[place], with excision recommended. Pathology results were
discussed with the patient by telephone. The patient reported doing
well after the biopsies with tenderness at the sites. Post biopsy
instructions and care were reviewed and questions were answered. The
patient was encouraged to call [REDACTED] for any additional concerns. Surgical consultation has been
arranged with Dr. Sorin Oxendine at [REDACTED] on [address]

Pathology results reported by Singh Philpott, RN on 03/08/2017.
:
Due to administrative error please disregard the original report.
This is the report for patient BUCH, PAYASITO for date of
service 03/07/2017.
CLINICAL DATA: Right breast mass
EXAM:
ULTRASOUND GUIDED RIGHT BREAST CORE NEEDLE BIOPSY

[Series 1: us breast bx w loc dev 1st lesion img bx spec us g · 0.05mm/px · 9 of 9 slices shown]
[im 1/9]
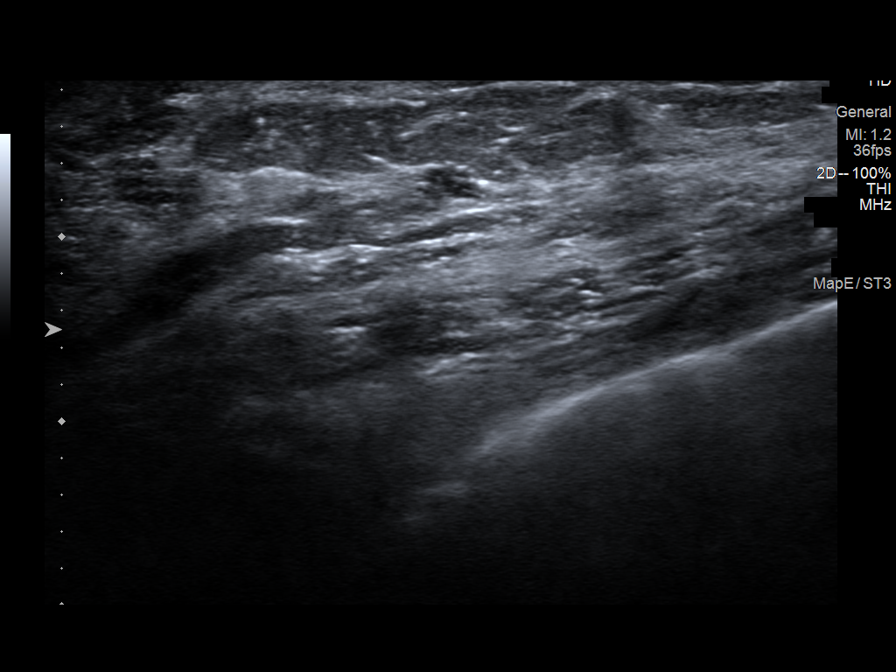
[im 2/9]
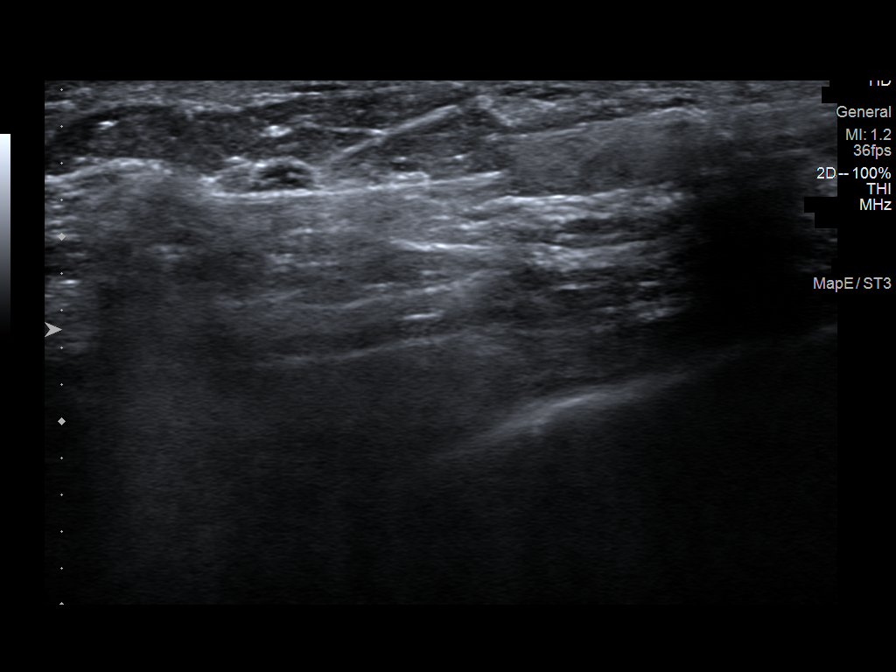
[im 3/9]
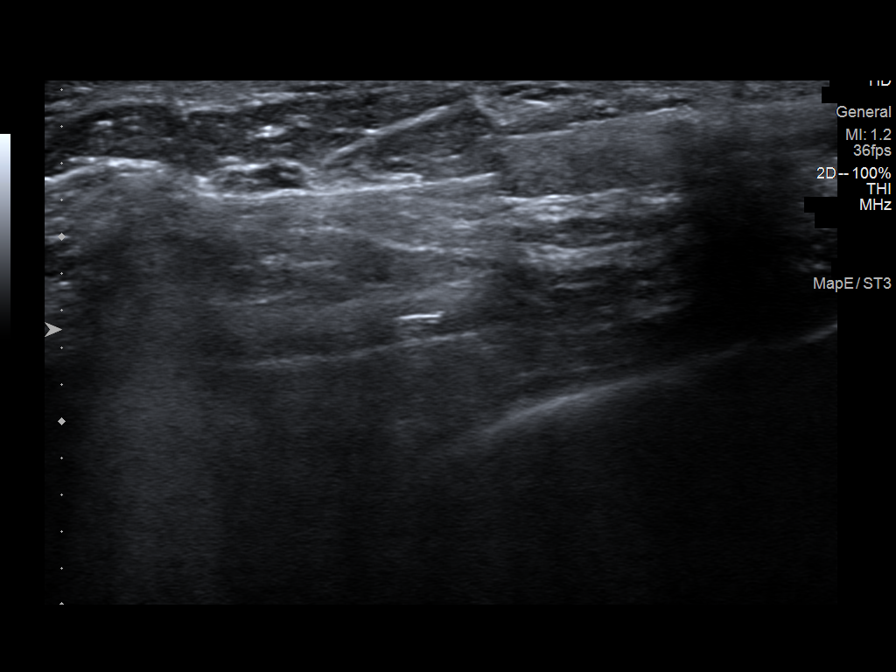
[im 4/9]
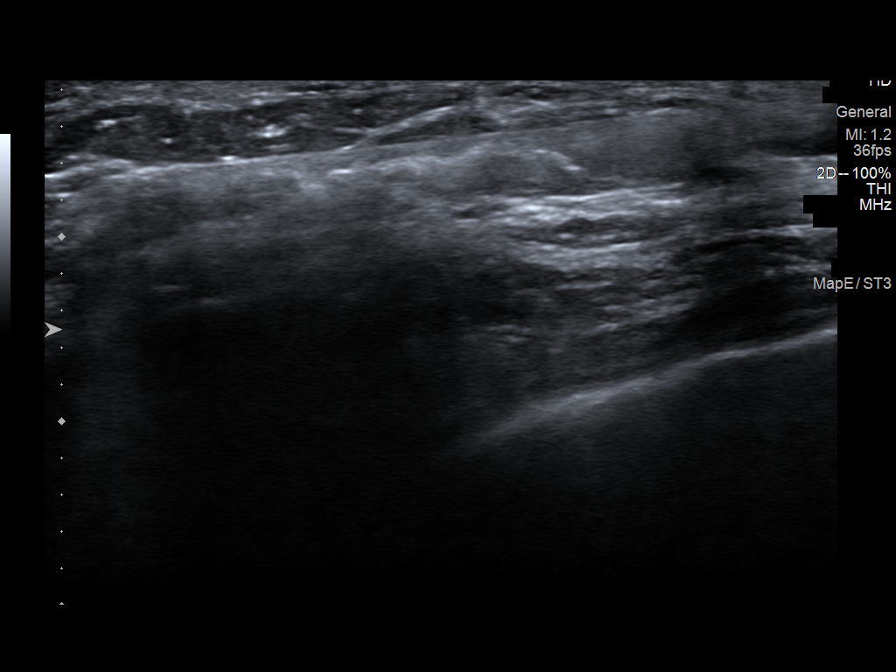
[im 5/9]
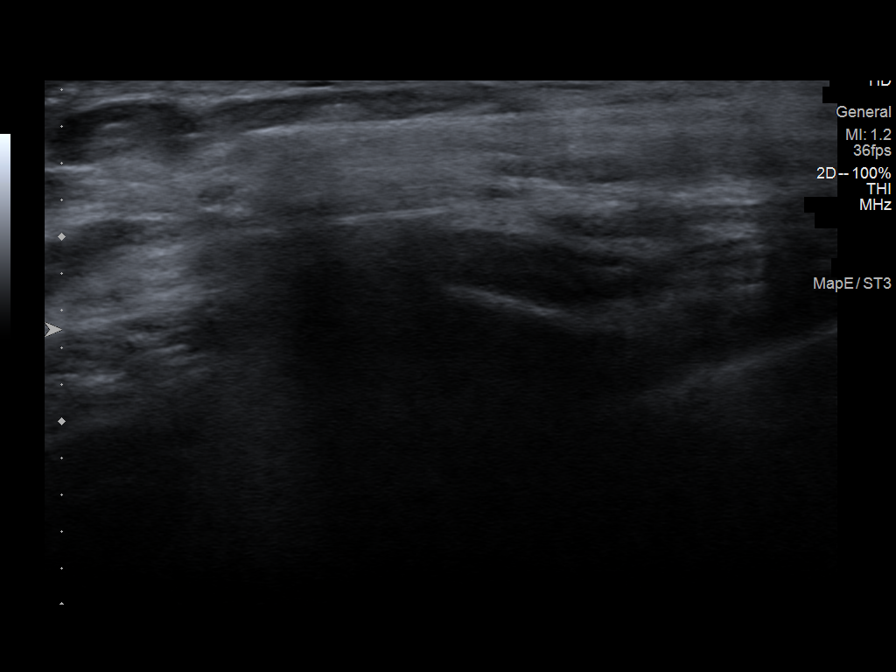
[im 6/9]
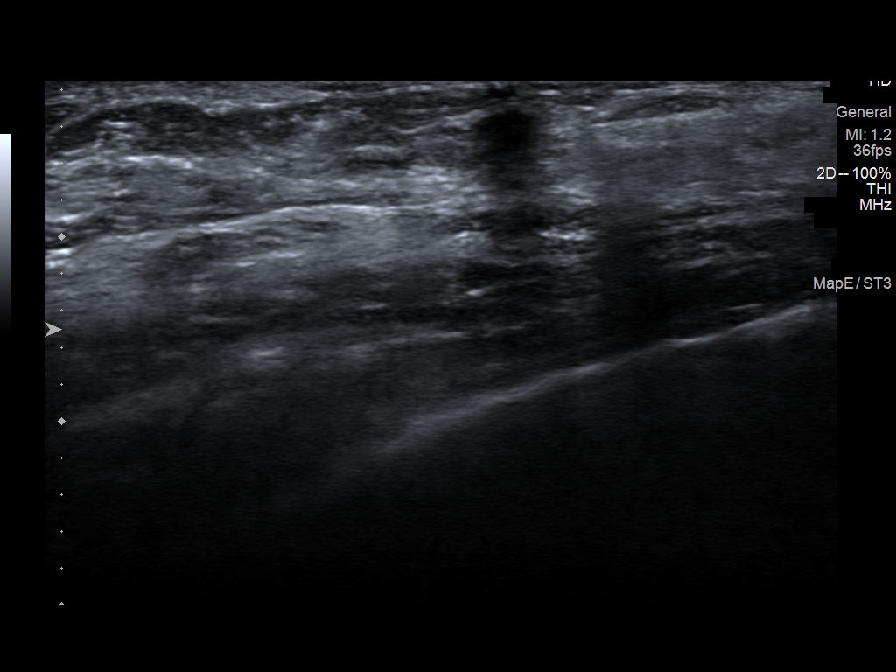
[im 7/9]
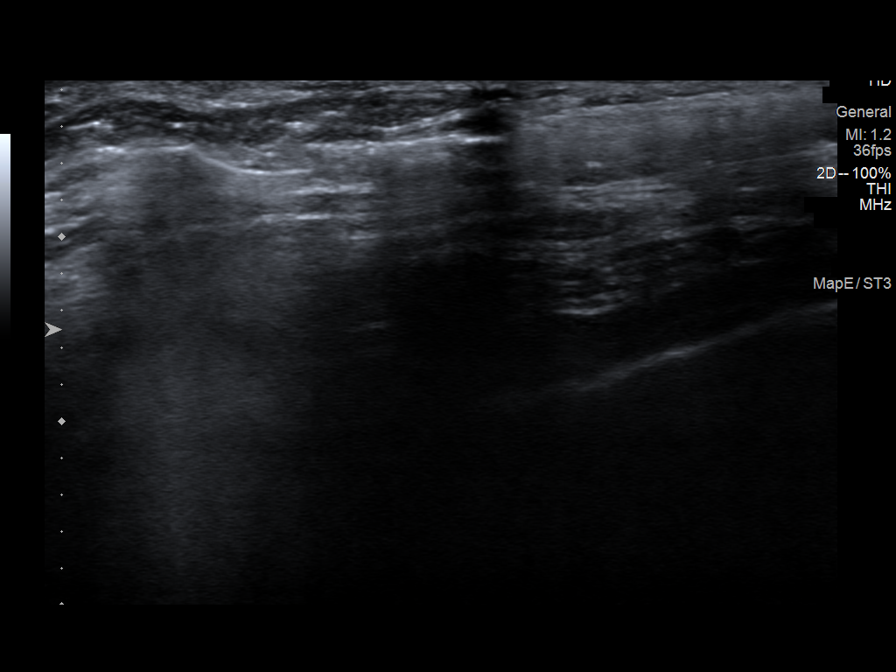
[im 8/9]
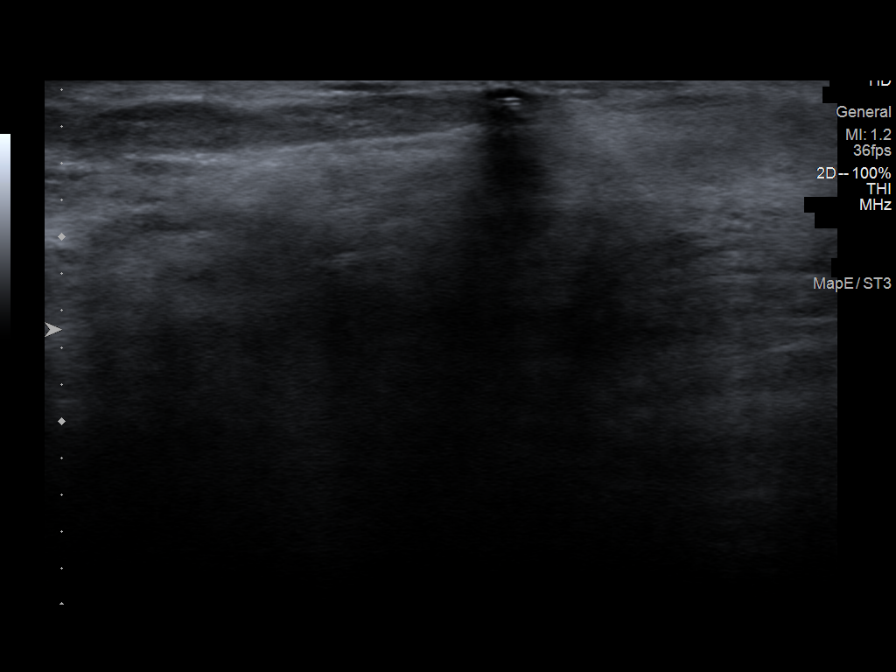
[im 9/9]
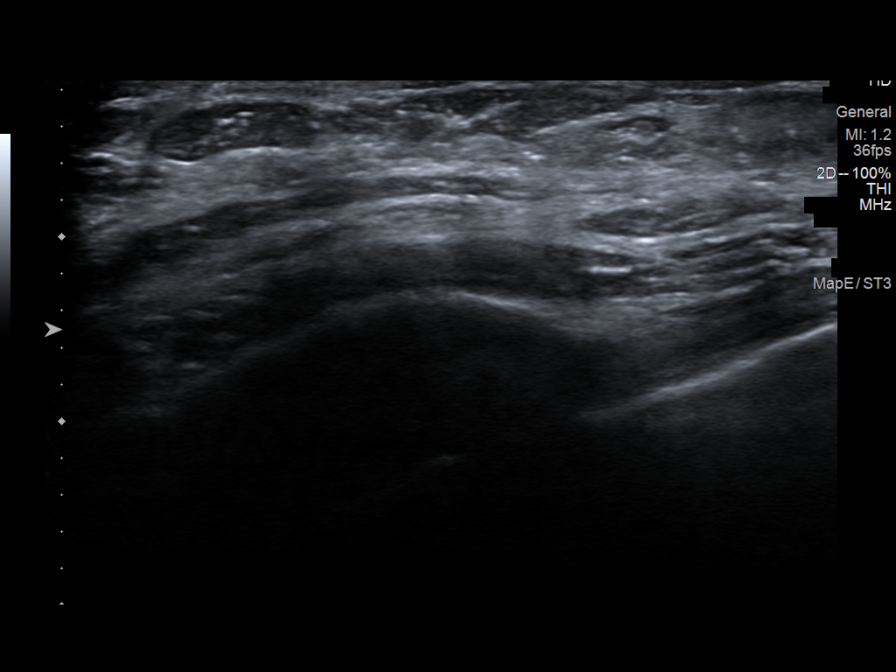

[9 of 9 positions shown; findings below may reference images not displayed]



Using sterile technique and 1% Lidocaine as local anesthetic, under
direct ultrasound visualization, a 12 gauge Ceejay device was
used to perform biopsy of a complex cystic mass using a lateral
approach. At the conclusion of the procedure a tissue marker clip
was deployed into the biopsy cavity. Follow up 2 view mammogram was
performed and dictated separately.
IMPRESSION: Ultrasound guided biopsy of a complex cystic mass in the right
breast at 8 o'clock, 2 cm from the nipple. No apparent
complications.

## 2020-03-12 ENCOUNTER — Ambulatory Visit
Admission: EM | Admit: 2020-03-12 | Discharge: 2020-03-12 | Disposition: A | Discharge status: Home / Self Care | Payer: BLUE CROSS/BLUE SHIELD

## 2020-03-12 ENCOUNTER — Other Ambulatory Visit: Payer: Self-pay

## 2020-03-12 DIAGNOSIS — M654 Radial styloid tenosynovitis [de Quervain]: Secondary | ICD-10-CM | POA: Diagnosis not present

## 2020-03-12 MED ORDER — PREDNISONE 50 MG PO TABS: 50.0000 mg | ORAL_TABLET | Freq: Every day | ORAL | 0 refills | Status: AC

## 2020-03-12 NOTE — Discharge Instructions (Signed)
Start prednisone as directed. Ice compress. Wear splint during activity and when sleeping. Follow up with orthopedics if symptoms not improving.

## 2020-03-12 NOTE — ED Provider Notes (Signed)
[facility]    CSN: [id number] Arrival date & time: 03/12/20  1052      History   Chief Complaint Chief Complaint  Patient presents with  . Wrist Pain    HPI Hannah Glover is a 54 y.o. female.   54 year old female comes in for 2 week history of atraumatic right wrist pain. Pain is mostly to the radial aspect of the wrist, worse with ROM. Slight swelling. Denies radiation of pain. Work requires repetitive motion. Ibuprofen without relief. RHD     Past Medical History:  Diagnosis Date  . Intraductal papilloma of breast, right   . Smoker     Patient Active Problem List   Diagnosis Date Noted  . Intraductal papilloma of breast 03/29/2012    Past Surgical History:  Procedure Laterality Date  . APPENDECTOMY    . BREAST EXCISIONAL BIOPSY Right   . BREAST LUMPECTOMY WITH RADIOACTIVE SEED LOCALIZATION Right 04/07/2017   Procedure: RIGHT BREAST LUMPECTOMY WITH RADIOACTIVE SEED LOCALIZATION ERAS PATHWAY;  Surgeon: Jovita Kussmaul, MD;  Location: [facility];  Service: General;  Laterality: Right;  . BREAST SURGERY  05/03/12   Lumpectomy  . TUBAL LIGATION      OB History    Gravida  2   Para  2   Term  2   Preterm      AB      Living  2     SAB      TAB      Ectopic      Multiple      Live Births  2            Home Medications    Prior to Admission medications   Medication Sig Start Date End Date Taking? Authorizing Provider  Multiple Vitamins-Minerals (CENTRUM SILVER 50+WOMEN PO) Centrum Silver   Yes [provider]  predniSONE (DELTASONE) 50 MG tablet Take 1 tablet (50 mg total) by mouth daily with breakfast. 03/12/20   Ok Edwards, PA-C    Family History Family History  Problem Relation Age of Onset  . Stroke Maternal Grandmother   . Liver disease Father   . Breast cancer Mother 76  . Cancer Mother        breast  . Cancer Paternal Uncle        colon    Social History Social History    Tobacco Use  . Smoking status: Current Every Day Smoker    Packs/day: 0.50    Types: Cigarettes  . Smokeless tobacco: Never Used  Substance Use Topics  . Alcohol use: Yes    Comment: social  . Drug use: No     Allergies   Patient has no known allergies.   Review of Systems Review of Systems  Reason unable to perform ROS: See HPI as above.     Physical Exam Triage Vital Signs ED Triage Vitals  Enc Vitals Group     BP 03/12/20 1116 125/82     Pulse Rate 03/12/20 1116 86     Resp 03/12/20 1119 19     Temp 03/12/20 1116 98.3 F (36.8 C)     Temp Source 03/12/20 1116 Oral     SpO2 03/12/20 1116 98 %     Weight --      Height --      Head Circumference --      Peak Flow --      Pain Score 03/12/20 1114 5  Pain Loc --      Pain Edu? --      Excl. in [place]? --    No data found.  Updated Vital Signs BP 125/82 (BP Location: Left Arm)   Pulse 86   Temp 98.3 F (36.8 C) (Oral)   Resp 19   LMP 02/10/2017   SpO2 98%   Physical Exam Constitutional:      General: She is not in acute distress.    Appearance: Normal appearance. She is well-developed. She is not toxic-appearing or diaphoretic.  HENT:     Head: Normocephalic and atraumatic.  Eyes:     Conjunctiva/sclera: Conjunctivae normal.     Pupils: Pupils are equal, round, and reactive to light.  Pulmonary:     Effort: Pulmonary effort is normal. No respiratory distress.  Musculoskeletal:     Cervical back: Normal range of motion and neck supple.     Comments: No swelling, erythema, warmth, contusion seen. Tenderness along radial aspect of the wrist, 1st MCP. No tenderness to the finger, forearm. Full ROM of wrist and fingers, though with pain. NVI  Skin:    General: Skin is warm and dry.  Neurological:     Mental Status: She is alert and oriented to person, place, and time.      UC Treatments / Results  Labs (all labs ordered are listed, but only abnormal results are displayed) Labs Reviewed - No  data to display  EKG   Radiology No results found.  Procedures Procedures (including critical care time)  Medications Ordered in UC Medications - No data to display  Initial Impression / Assessment and Plan / UC Course  I have reviewed the triage vital signs and the nursing notes.  Pertinent labs & imaging results that were available during my care of the patient were reviewed by me and considered in my medical decision making (see chart for details).    History and exam consistent with De Quervain's tendinitis. Prednisone as directed. Ice compress, thumb spica. Ortho follow up if symptoms not improving. Return precautions given.  Final Clinical Impressions(s) / UC Diagnoses   Final diagnoses:  Tendinitis, de Quervain's   ED Prescriptions    Medication Sig Dispense Auth. Provider   predniSONE (DELTASONE) 50 MG tablet Take 1 tablet (50 mg total) by mouth daily with breakfast. 5 tablet Ok Edwards, PA-C     PDMP not reviewed this encounter.   Ok Edwards, PA-C 03/12/20 1152

## 2020-03-12 NOTE — ED Triage Notes (Signed)
Patient in with c/o right wrist pain that has worsened over last 2 weeks.  Pain is worse with flexion and extension  Patient has been taking ibuprofen for pain with no relief  Denies numbness, tingling, or recent injury/trauma to area
